# Patient Record
Sex: Female | Born: 1955 | Race: White | Hispanic: No | Marital: Married | Smoking: Never smoker
Health system: Southern US, Community
[De-identification: ages and names within clinical notes are randomized; demographics above are authoritative.]

## PROBLEM LIST (undated history)

## (undated) DIAGNOSIS — Z8619 Personal history of other infectious and parasitic diseases: Secondary | ICD-10-CM

## (undated) DIAGNOSIS — N809 Endometriosis, unspecified: Secondary | ICD-10-CM

## (undated) DIAGNOSIS — Z9889 Other specified postprocedural states: Secondary | ICD-10-CM

## (undated) DIAGNOSIS — I499 Cardiac arrhythmia, unspecified: Secondary | ICD-10-CM

## (undated) DIAGNOSIS — K219 Gastro-esophageal reflux disease without esophagitis: Secondary | ICD-10-CM

## (undated) DIAGNOSIS — H6121 Impacted cerumen, right ear: Secondary | ICD-10-CM

## (undated) DIAGNOSIS — R112 Nausea with vomiting, unspecified: Secondary | ICD-10-CM

## (undated) HISTORY — DX: Endometriosis, unspecified: N80.9

## (undated) HISTORY — DX: Gastro-esophageal reflux disease without esophagitis: K21.9

## (undated) HISTORY — DX: Impacted cerumen, right ear: H61.21

## (undated) HISTORY — DX: Personal history of other infectious and parasitic diseases: Z86.19

## (undated) HISTORY — DX: Cardiac arrhythmia, unspecified: I49.9

---
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
## 1990-08-15 DIAGNOSIS — N809 Endometriosis, unspecified: Secondary | ICD-10-CM

## 1990-08-15 HISTORY — PX: LAPAROSCOPIC ENDOMETRIOSIS FULGURATION: SUR769

## 1990-08-15 HISTORY — DX: Endometriosis, unspecified: N80.9

## 1998-05-27 ENCOUNTER — Other Ambulatory Visit: Admission: RE | Admit: 1998-05-27 | Discharge: 1998-05-27 | Payer: Self-pay | Admitting: Gynecology

## 1999-06-28 ENCOUNTER — Other Ambulatory Visit: Admission: RE | Admit: 1999-06-28 | Discharge: 1999-06-28 | Payer: Self-pay | Admitting: Obstetrics and Gynecology

## 2000-07-11 ENCOUNTER — Other Ambulatory Visit: Admission: RE | Admit: 2000-07-11 | Discharge: 2000-07-11 | Payer: Self-pay | Admitting: Obstetrics and Gynecology

## 2001-07-27 ENCOUNTER — Other Ambulatory Visit: Admission: RE | Admit: 2001-07-27 | Discharge: 2001-07-27 | Payer: Self-pay | Admitting: Obstetrics and Gynecology

## 2002-10-23 ENCOUNTER — Other Ambulatory Visit: Admission: RE | Admit: 2002-10-23 | Discharge: 2002-10-23 | Payer: Self-pay | Admitting: Obstetrics and Gynecology

## 2003-12-10 ENCOUNTER — Other Ambulatory Visit: Admission: RE | Admit: 2003-12-10 | Discharge: 2003-12-10 | Payer: Self-pay | Admitting: Obstetrics and Gynecology

## 2004-12-15 ENCOUNTER — Other Ambulatory Visit: Admission: RE | Admit: 2004-12-15 | Discharge: 2004-12-15 | Payer: Self-pay | Admitting: Obstetrics and Gynecology

## 2006-07-19 ENCOUNTER — Other Ambulatory Visit: Admission: RE | Admit: 2006-07-19 | Discharge: 2006-07-19 | Payer: Self-pay | Admitting: Obstetrics and Gynecology

## 2008-04-03 ENCOUNTER — Encounter: Admission: RE | Admit: 2008-04-03 | Discharge: 2008-04-03 | Payer: Self-pay | Admitting: Family Medicine

## 2010-02-22 ENCOUNTER — Encounter: Admission: RE | Admit: 2010-02-22 | Discharge: 2010-02-22 | Payer: Self-pay | Admitting: Family Medicine

## 2010-05-19 LAB — LAB REPORT - SCANNED: A1c: 5.8

## 2011-07-11 LAB — HM MAMMOGRAPHY: HM Mammogram: NORMAL

## 2011-09-10 LAB — HM PAP SMEAR: HM Pap smear: NORMAL

## 2012-09-26 LAB — HEPATIC FUNCTION PANEL: ALT: 26 U/L (ref 7–35)

## 2012-09-26 LAB — CBC AND DIFFERENTIAL
Hemoglobin: 13.2 g/dL (ref 12.0–16.0)
Platelets: 219 10*3/uL (ref 150–399)
WBC: 4 10^3/mL

## 2012-09-26 LAB — BASIC METABOLIC PANEL
BUN: 11 mg/dL (ref 4–21)
Glucose: 91 mg/dL

## 2012-10-10 ENCOUNTER — Encounter: Payer: Self-pay | Admitting: Internal Medicine

## 2012-10-10 ENCOUNTER — Ambulatory Visit (INDEPENDENT_AMBULATORY_CARE_PROVIDER_SITE_OTHER): Payer: Managed Care, Other (non HMO) | Admitting: Internal Medicine

## 2012-10-10 VITALS — BP 112/74 | HR 82 | Temp 98.7°F | Resp 12 | Ht 63.75 in | Wt 158.5 lb

## 2012-10-10 DIAGNOSIS — K219 Gastro-esophageal reflux disease without esophagitis: Secondary | ICD-10-CM

## 2012-10-10 DIAGNOSIS — Z1239 Encounter for other screening for malignant neoplasm of breast: Secondary | ICD-10-CM

## 2012-10-10 DIAGNOSIS — M545 Low back pain, unspecified: Secondary | ICD-10-CM

## 2012-10-10 DIAGNOSIS — E663 Overweight: Secondary | ICD-10-CM

## 2012-10-10 DIAGNOSIS — Z6825 Body mass index (BMI) 25.0-25.9, adult: Secondary | ICD-10-CM

## 2012-10-10 NOTE — Progress Notes (Signed)
Patient ID: Melanie Ward, female   DOB: [birth year], 57 y.o.   MRN: [id number]   Patient Active Problem List  Diagnosis  . Lumbago  . Overweight (BMI 25.0-29.9)  . GERD (gastroesophageal reflux disease)    Subjective:  CC:   Chief Complaint  Patient presents with  . Establish Care    HPI:    Melanie Ward is a 57 y.o. female who presents as a new patient to establish primary care with the chief complaint of 1) Occasional episodes of acute back pain and right elbow pain.   She has a history of Chondromalacia patella diagnosed by sports medicine physician Dr. Karel Jarvis at [facility]. She has avoided pharmacotherapy and has started an elimination diet to rule out food intolerances . Thus far she has given up dairy and gluten with no appreciable change in symptoms.   Has had formal serologic testing for allergies but no skin testing .   2) History of GERD .  Aggravated by certain foods. Controlled with when necessary use of over-the-counter medications. No history of chronic cough.    Past Medical History  Diagnosis Date  . History of chicken pox   . GERD (gastroesophageal reflux disease)   . Cardiac arrhythmia   . Endometriosis 1992    Past Surgical History  Procedure Laterality Date  . Tonsillectomy and adenoidectomy  1964  . Laparoscopic endometriosis fulguration  1992  . Appendectomy  1973    Family History  Problem Relation Age of Onset  . Cancer Mother     breast  . Hypertension Mother   . Heart disease Father   . Diabetes Maternal Grandmother     History   Social History  . Marital Status: Married    Spouse Name: N/A    Number of Children: N/A  . Years of Education: N/A   Occupational History  . Not on file.   Social History Main Topics  . Smoking status: Never Smoker   . Smokeless tobacco: Never Used  . Alcohol Use: No  . Drug Use: No  . Sexually Active: Not on file   Other Topics Concern  . Not on file   Social History  Narrative  . No narrative on file   Allergies  Allergen Reactions  . Zithromax (Azithromycin) Rash    Review of Systems:   Patient denies headache, fevers, malaise, unintentional weight loss, skin rash, eye pain, sinus congestion and sinus pain, sore throat, dysphagia,  hemoptysis , cough, dyspnea, wheezing, chest pain, palpitations, orthopnea, edema, abdominal pain, nausea, melena, diarrhea, constipation, flank pain, dysuria, hematuria, urinary  Frequency, nocturia, numbness, tingling, seizures,  Focal weakness, Loss of consciousness,  Tremor, insomnia, depression, anxiety, and suicidal ideation.   Objective:  BP 112/74  Pulse 82  Temp(Src) 98.7 F (37.1 C) (Oral)  Resp 12  Ht 5' 3.75" (1.619 m)  Wt 158 lb 8 oz (71.895 kg)  BMI 27.43 kg/m2  SpO2 97%  General appearance: alert, cooperative and appears stated age Ears: normal TM's and external ear canals both ears Throat: lips, mucosa, and tongue normal; teeth and gums normal Neck: no adenopathy, no carotid bruit, supple, symmetrical, trachea midline and thyroid not enlarged, symmetric, no tenderness/mass/nodules Back: symmetric, no curvature. ROM normal. No CVA tenderness. Lungs: clear to auscultation bilaterally Heart: regular rate and rhythm, S1, S2 normal, no murmur, click, rub or gallop Abdomen: soft, non-tender; bowel sounds normal; no masses,  no organomegaly Pulses: 2+ and symmetric Skin: Skin color, texture, turgor normal. No  rashes or lesions Lymph nodes: Cervical, supraclavicular, and axillary nodes normal. MSK: full ROM all joints,  No synvovitis, ulnar deviation or Heberden's nodes  Assessment and Plan:  Overweight (BMI 25.0-29.9) Recent screening by former PCP Dr. Renae Gloss last week noted normal thyroid function normal lipids and a hemoglobin A1c that puts her at increased risk for diabetes of 5.9%..  I have addressed  BMI and recommended a low glycemic index diet utilizing smaller more frequent meals to increase  metabolism.  I have also recommended that patient start exercising with a goal of 30 minutes of aerobic exercise a minimum of 5 days per week.    GERD (gastroesophageal reflux disease) Symptoms do not occur daily and are controlled with when necessary use of H2 blockers and Mylanta.  Lumbago Episodic, with recent testing by former PCP for autoimmune disease due to concurrent elbow pain. Her ANA was positive but her sedimentation rate was 2. Lyme antibodies were also checked and were negative. She has no signs of cellulitis on today's exam. We'll discuss referral to rheumatology with patient at next visit.   Updated Medication List Outpatient Encounter Prescriptions as of 10/10/2012  Medication Sig Dispense Refill  . Cholecalciferol (VITAMIN D) 2000 UNITS CAPS Take by mouth.      . fish oil-omega-3 fatty acids 1000 MG capsule Take 2 g by mouth daily.      . Multiple Vitamin (MULTIVITAMIN) tablet Take 1 tablet by mouth daily.       No facility-administered encounter medications on file as of 10/10/2012.     Orders Placed This Encounter  Procedures  . HM MAMMOGRAPHY  . MM Digital Screening  . HM PAP SMEAR  . HM COLONOSCOPY    No Follow-up on file.

## 2012-10-10 NOTE — Patient Instructions (Addendum)
There is a gluten free potato free vodka called Ciroc   I will review the records from Dr. Renae Gloss and advise you of the results

## 2012-10-11 ENCOUNTER — Encounter: Payer: Self-pay | Admitting: Internal Medicine

## 2012-10-13 ENCOUNTER — Telehealth: Payer: Self-pay | Admitting: Internal Medicine

## 2012-10-13 ENCOUNTER — Encounter: Payer: Self-pay | Admitting: Internal Medicine

## 2012-10-13 DIAGNOSIS — K219 Gastro-esophageal reflux disease without esophagitis: Secondary | ICD-10-CM | POA: Insufficient documentation

## 2012-10-13 NOTE — Assessment & Plan Note (Signed)
Symptoms do not occur daily and are controlled with when necessary use of H2 blockers and Mylanta.

## 2012-10-13 NOTE — Assessment & Plan Note (Signed)
Episodic, with recent testing by former PCP for autoimmune disease due to concurrent elbow pain. Her ANA was positive but her sedimentation rate was 2. Lyme antibodies were also checked and were negative. She has no signs of cellulitis on today's exam. We'll discuss referral to rheumatology with patient at next visit.

## 2012-10-13 NOTE — Assessment & Plan Note (Signed)
Recent screening by former PCP Dr. Renae Gloss last week noted normal thyroid function normal lipids and a hemoglobin A1c that puts her at increased risk for diabetes of 5.9%..  I have addressed  BMI and recommended a low glycemic index diet utilizing smaller more frequent meals to increase metabolism.  I have also recommended that patient start exercising with a goal of 30 minutes of aerobic exercise a minimum of 5 days per week.

## 2012-10-15 ENCOUNTER — Telehealth: Payer: Self-pay | Admitting: Internal Medicine

## 2012-10-15 ENCOUNTER — Encounter: Payer: Self-pay | Admitting: Internal Medicine

## 2012-10-15 DIAGNOSIS — M25531 Pain in right wrist: Secondary | ICD-10-CM

## 2012-11-09 ENCOUNTER — Encounter: Payer: Self-pay | Admitting: Internal Medicine

## 2013-02-27 ENCOUNTER — Encounter: Payer: Self-pay | Admitting: Internal Medicine

## 2013-02-27 ENCOUNTER — Ambulatory Visit (INDEPENDENT_AMBULATORY_CARE_PROVIDER_SITE_OTHER): Payer: Managed Care, Other (non HMO) | Admitting: Internal Medicine

## 2013-02-27 VITALS — BP 108/64 | HR 78 | Temp 98.1°F | Resp 14 | Wt 150.5 lb

## 2013-02-27 DIAGNOSIS — R197 Diarrhea, unspecified: Secondary | ICD-10-CM

## 2013-02-27 DIAGNOSIS — A0472 Enterocolitis due to Clostridium difficile, not specified as recurrent: Secondary | ICD-10-CM | POA: Insufficient documentation

## 2013-02-27 DIAGNOSIS — R21 Rash and other nonspecific skin eruption: Secondary | ICD-10-CM

## 2013-02-27 DIAGNOSIS — K521 Toxic gastroenteritis and colitis: Secondary | ICD-10-CM

## 2013-02-27 DIAGNOSIS — T3695XA Adverse effect of unspecified systemic antibiotic, initial encounter: Secondary | ICD-10-CM

## 2013-02-27 MED ORDER — CIPROFLOXACIN HCL 500 MG PO TABS: 500.0000 mg | ORAL_TABLET | Freq: Two times a day (BID) | ORAL | Status: DC

## 2013-02-27 MED ORDER — METRONIDAZOLE 500 MG PO TABS: 500.0000 mg | ORAL_TABLET | Freq: Four times a day (QID) | ORAL | Status: DC

## 2013-02-27 NOTE — Assessment & Plan Note (Addendum)
Likely viral given rapid imporvement but will test for c dif given recent abx use.,   Stool tested positive for C dificile and was resulted on Sunday.  Rx for metronidazole was given to patient at her visit along with cipro and he was inxtructed at visit to start both if diarrea did not resolve by Friday.  She was notified of the positive results on sunday and advised to start the metronidazole

## 2013-02-27 NOTE — Assessment & Plan Note (Signed)
Given her positive ANA will refer to dermatology for evaluation.  Trial of tar shampoo.

## 2013-02-27 NOTE — Patient Instructions (Addendum)
Your diarrhea is likely a slef limiting viral gastroenteritis and should  resolve on its own with a  Simplified diet (see attached)  However, given your recent antibiotic use, we will rule out C dificile with stool tests. This would be treated with metronidazole  If the diarrhea is NOT  c dificile and does not resolve by Friday,  You can start the cipro/metronidazole

## 2013-02-27 NOTE — Progress Notes (Signed)
Patient ID: Melanie Ward, female   DOB: [birth year], 57 y.o.   MRN: [id number]  Patient Active Problem List   Diagnosis Date Noted  . Antibiotic-associated diarrhea 02/27/2013  . Rash and nonspecific skin eruption 02/27/2013  . Lumbago 10/13/2012  . Overweight (BMI 25.0-29.9) 10/13/2012  . GERD (gastroesophageal reflux disease) 10/13/2012    Subjective:  CC:   Chief Complaint  Patient presents with  . Acute Visit    stomach issue's  , loose stool all day saturday and still not normal.    HPI:   Melanie G Johnsonis a 57 y.o. female who presents for evaluation of 4 day history of diarrhea which is improving.  Patient woke up at 4 am on Saturday with abdominal distension and developed loose stools 10 to 15 throught the next 24 hours.  Sunday felt a little better and stools were less frequent .  No fevers, blood in stools or severe cramping ,  No nausea.  Currently stools are still unformed but no tenesmus and has gone back to work.  recent ingestion of hemp/chocolate bar  For 2 days prior to onset,  And Took oral  clindamycin for 10 days for an oral procedure,  Ending around the fourth of July ,   2) pruritic rash on scalp in occipital area for the last month.  Husband noted a blistering appearance to rash a week or so ago but rash is not painful, just itchy,  No exposure to children or fleas.  No recent travel.  Had a rheumatologic workup earlier in the year for diffuse arthritis symptoms accompanied by a  positive ANA (titers available in chart) but nothing was diagnosed.     Past Medical History  Diagnosis Date  . History of chicken pox   . GERD (gastroesophageal reflux disease)   . Cardiac arrhythmia   . Endometriosis 1992    Past Surgical History  Procedure Laterality Date  . Tonsillectomy and adenoidectomy  1964  . Laparoscopic endometriosis fulguration  1992  . Appendectomy  1973       The following portions of the patient's history were reviewed and updated as  appropriate: Allergies, current medications, and problem list.    Review of Systems:   12 Pt  review of systems was negative except those addressed in the HPI,     History   Social History  . Marital Status: Married    Spouse Name: N/A    Number of Children: N/A  . Years of Education: N/A   Occupational History  . Not on file.   Social History Main Topics  . Smoking status: Never Smoker   . Smokeless tobacco: Never Used  . Alcohol Use: No  . Drug Use: No  . Sexually Active: Not on file   Other Topics Concern  . Not on file   Social History Narrative  . No narrative on file    Objective:  BP 108/64  Pulse 78  Temp(Src) 98.1 F (36.7 C) (Oral)  Resp 14  Wt 150 lb 8 oz (68.266 kg)  BMI 26.04 kg/m2  SpO2 98%  General appearance: alert, cooperative and appears stated age Ears: normal TM's and external ear canals both ears Throat: lips, mucosa, and tongue normal; teeth and gums normal Neck: no adenopathy, no carotid bruit, supple, symmetrical, trachea midline and thyroid not enlarged, symmetric, no tenderness/mass/nodules Back: symmetric, no curvature. ROM normal. No CVA tenderness. Lungs: clear to auscultation bilaterally Heart: regular rate and rhythm, S1, S2 normal, no murmur, click,  rub or gallop Abdomen: soft, non-tender; bowel sounds normal; no masses,  no organomegaly Pulses: 2+ and symmetric Skin: Skin color, texture, turgor normal. No rashes or lesions Lymph nodes: Cervical, supraclavicular, and axillary nodes normal.  Assessment and Plan:  Rash and nonspecific skin eruption Given her positive ANA will refer to dermatology for evaluation.  Trial of tar shampoo.   Antibiotic-associated diarrhea Likely viral given rapid imporvement but will test for c dif given recent abx use.,     Updated Medication List Outpatient Encounter Prescriptions as of 02/27/2013  Medication Sig Dispense Refill  . Cholecalciferol (VITAMIN D) 2000 UNITS CAPS Take by  mouth.      . fish oil-omega-3 fatty acids 1000 MG capsule Take 2 g by mouth daily.      . Multiple Vitamin (MULTIVITAMIN) tablet Take 1 tablet by mouth daily.      . ciprofloxacin (CIPRO) 500 MG tablet Take 1 tablet (500 mg total) by mouth 2 (two) times daily.  14 tablet  0  . metroNIDAZOLE (FLAGYL) 500 MG tablet Take 1 tablet (500 mg total) by mouth 4 (four) times daily.  28 tablet  0  . [DISCONTINUED] ciprofloxacin (CIPRO) 500 MG tablet Take 1 tablet (500 mg total) by mouth 2 (two) times daily.  14 tablet  0   No facility-administered encounter medications on file as of 02/27/2013.     Orders Placed This Encounter  Procedures  . C difficile Toxins A+B W/Rflx  . Ambulatory referral to Dermatology    No Follow-up on file.

## 2013-02-28 NOTE — Addendum Note (Signed)
Addended by: Montine Circle D on: 02/28/2013 08:59 AM   Modules accepted: Orders

## 2013-03-03 ENCOUNTER — Encounter: Payer: Self-pay | Admitting: Internal Medicine

## 2013-03-04 ENCOUNTER — Encounter: Payer: Self-pay | Admitting: Emergency Medicine

## 2013-03-04 ENCOUNTER — Telehealth: Payer: Self-pay | Admitting: Internal Medicine

## 2013-03-04 NOTE — Telephone Encounter (Signed)
Patient Information:  Caller Name: Yesika  Phone: [phone]  Patient: Melanie Ward, Melanie Ward  Gender: Female  DOB: [birth year]  Age: 57 Years  PCP: Duncan Dull (Adults only)  Office Follow Up:  Does the office need to follow up with this patient?: No  Instructions For The Office: N/A  RN Note:  MD message regarding + stool culture and treatment plan read to patient. Agreed to take Metronidazole as directed.    Symptoms  Reason For Call & Symptoms: Called for message after missed call on cell phone.  [place].  Per Epic,  reviewed my chart message from Dr Darrick Huntsman 03/03/13 regarding + stool culture for C-Diff.  MD gave Rx for Metronidzaole at time of office visit. Continues to have more frequent stools than normal with abnormal smell and appearance.  Verbalized understanding of instructions including need completed Rx as directed (QID X 7 days) and to avoid all alcohol while using Metronidiazole.  Reviewed Health History In EMR: N/A  Reviewed Medications In EMR: N/A  Reviewed Allergies In EMR: N/A  Reviewed Surgeries / Procedures: N/A  Date of Onset of Symptoms: Unknown  Guideline(s) Used:  No Protocol Available - Information Only  Disposition Per Guideline:   Home Care  Reason For Disposition Reached:   Information only question and nurse able to answer  Advice Given:  Call Back If:  New symptoms develop  You become worse.  Patient Will Follow Care Advice:  YES

## 2013-05-27 ENCOUNTER — Encounter: Payer: Self-pay | Admitting: Internal Medicine

## 2013-06-19 ENCOUNTER — Ambulatory Visit (INDEPENDENT_AMBULATORY_CARE_PROVIDER_SITE_OTHER): Payer: Managed Care, Other (non HMO) | Admitting: Internal Medicine

## 2013-06-19 ENCOUNTER — Encounter: Payer: Self-pay | Admitting: Internal Medicine

## 2013-06-19 VITALS — BP 130/70 | HR 81 | Temp 98.1°F | Ht 63.75 in | Wt 155.5 lb

## 2013-06-19 DIAGNOSIS — E785 Hyperlipidemia, unspecified: Secondary | ICD-10-CM

## 2013-06-19 DIAGNOSIS — R21 Rash and other nonspecific skin eruption: Secondary | ICD-10-CM

## 2013-06-19 DIAGNOSIS — A0472 Enterocolitis due to Clostridium difficile, not specified as recurrent: Secondary | ICD-10-CM

## 2013-06-19 DIAGNOSIS — E663 Overweight: Secondary | ICD-10-CM

## 2013-06-19 DIAGNOSIS — R5381 Other malaise: Secondary | ICD-10-CM

## 2013-06-19 DIAGNOSIS — Z6825 Body mass index (BMI) 25.0-25.9, adult: Secondary | ICD-10-CM

## 2013-06-19 DIAGNOSIS — M25579 Pain in unspecified ankle and joints of unspecified foot: Secondary | ICD-10-CM

## 2013-06-19 DIAGNOSIS — R635 Abnormal weight gain: Secondary | ICD-10-CM

## 2013-06-19 NOTE — Progress Notes (Signed)
Pre-visit discussion using our clinic review tool. No additional management support is needed unless otherwise documented below in the visit note.  

## 2013-06-19 NOTE — Progress Notes (Signed)
Patient ID: Geannie Risen, female   DOB: 11/16/1955, 57 y.o.   MRN: [id number]  Patient Active Problem List   Diagnosis Date Noted  . Other malaise and fatigue 06/21/2013  . Enteritis due to Clostridium difficile 02/27/2013  . Rash and nonspecific skin eruption 02/27/2013  . Lumbago 10/13/2012  . Overweight (BMI 25.0-29.9) 10/13/2012  . GERD (gastroesophageal reflux disease) 10/13/2012    Subjective:  CC:   Chief Complaint  Patient presents with  . Follow-up    not feeling well, craving sweets (pre diabetic), & fatigue x 2 months     HPI:   Calina G Johnsonis a 57 y.o. female who presents with fatigue and malaise for the past 2 months .  Was feeling great after starting an exercise progream and changing diet to gluten free.  The in August developed a diffuse herpetiform /papular rash on hips,  Elbows  Back of knees and hands. Lasted for a month.  Saw dermatologists ( Dr Dorita Sciara)  [place] in [place] Who biopsied one of the lesions.  Path was reportedly negative for celiac disease ,  Looked like drug induced , but she was not taking any medication except the same MVI she had been taking for over a year.  Rash resolved spontaneously but she continues to have several papules on both flanks.  No fevers,  URi symptoms, headaches,   Since then has been unable to exercise and maintain a sensible diet.  Feels aching pain diffusely. Some joints ache each morning but improve with activity     Past Medical History  Diagnosis Date  . History of chicken pox   . GERD (gastroesophageal reflux disease)   . Cardiac arrhythmia   . Endometriosis 1992    Past Surgical History  Procedure Laterality Date  . Tonsillectomy and adenoidectomy  1964  . Laparoscopic endometriosis fulguration  1992  . Appendectomy  1973       The following portions of the patient's history were reviewed and updated as appropriate: Allergies, current medications, and problem list.    Review of Systems:   12 Pt  review of  systems was negative except those addressed in the HPI,     History   Social History  . Marital Status: Married    Spouse Name: N/A    Number of Children: N/A  . Years of Education: N/A   Occupational History  . Not on file.   Social History Main Topics  . Smoking status: Never Smoker   . Smokeless tobacco: Never Used  . Alcohol Use: No  . Drug Use: No  . Sexual Activity: Yes    Birth Control/ Protection: Post-menopausal   Other Topics Concern  . Not on file   Social History Narrative  . No narrative on file    Objective:  Filed Vitals:   06/19/13 1408  BP: 130/70  Pulse: 81  Temp: 98.1 F (36.7 C)     General appearance: alert, cooperative and appears stated age Ears: normal TM's and external ear canals both ears Throat: lips, mucosa, and tongue normal; teeth and gums normal Neck: no adenopathy, no carotid bruit, supple, symmetrical, trachea midline and thyroid not enlarged, symmetric, no tenderness/mass/nodules Back: symmetric, no curvature. ROM normal. No CVA tenderness. Lungs: clear to auscultation bilaterally Heart: regular rate and rhythm, S1, S2 normal, no murmur, click, rub or gallop Abdomen: soft, non-tender; bowel sounds normal; no masses,  no organomegaly Pulses: 2+ and symmetric Skin: Skin color, texture, turgor normal. No rashes or lesions  Lymph nodes: Cervical, supraclavicular, and axillary nodes normal.  Assessment and Plan:  Rash and nonspecific skin eruption With subsequent spread to torso and arms resulting in dermatology evaluation with biopsy reportedly showing drug effect.  May have been due to ciprofloxacin /metronidazole given in late July for C dificile enteritis.  Will rule out tick borne illness exposure.  Records requested  Overweight (BMI 25.0-29.9) I have  recommended low gluten/low glycemic index diet and regular exercise a minimum of 5 days per week. To help her achieve her goal     Other malaise and fatigue Exam is  normal. She is not anemic, no signs of inflammation by current labs.  She will return for fasting labs at her leisure.   A total of 40 minutes was spent with patient more than half of which was spent in counseling, reviewing records from other providers and coordination of care.  Updated Medication List Outpatient Encounter Prescriptions as of 06/19/2013  Medication Sig  . Cholecalciferol (VITAMIN D) 2000 UNITS CAPS Take by mouth.  . fish oil-omega-3 fatty acids 1000 MG capsule Take 2 g by mouth daily.  . Multiple Vitamin (MULTIVITAMIN) tablet Take 1 tablet by mouth daily.  . [DISCONTINUED] ciprofloxacin (CIPRO) 500 MG tablet Take 1 tablet (500 mg total) by mouth 2 (two) times daily.  . [DISCONTINUED] metroNIDAZOLE (FLAGYL) 500 MG tablet Take 1 tablet (500 mg total) by mouth 4 (four) times daily.     Orders Placed This Encounter  Procedures  . Lipid panel  . Hemoglobin A1c  . CBC with Differential  . TSH  . C-reactive protein  . Lyme Mauricia Area. Blt. IgG & IgM w/bands  . Rocky mtn spotted fvr abs pnl(IgG+IgM)  . Ehrlichia antibody panel  . CBC and differential  . Basic metabolic panel  . Hepatic function panel    No Follow-up on file.

## 2013-06-20 ENCOUNTER — Other Ambulatory Visit (INDEPENDENT_AMBULATORY_CARE_PROVIDER_SITE_OTHER): Payer: Managed Care, Other (non HMO)

## 2013-06-20 DIAGNOSIS — E785 Hyperlipidemia, unspecified: Secondary | ICD-10-CM

## 2013-06-20 DIAGNOSIS — R21 Rash and other nonspecific skin eruption: Secondary | ICD-10-CM

## 2013-06-20 DIAGNOSIS — R635 Abnormal weight gain: Secondary | ICD-10-CM

## 2013-06-20 DIAGNOSIS — M25579 Pain in unspecified ankle and joints of unspecified foot: Secondary | ICD-10-CM

## 2013-06-20 LAB — CBC WITH DIFFERENTIAL/PLATELET
Basophils Relative: 0.7 % (ref 0.0–3.0)
Eosinophils Absolute: 0.1 10*3/uL (ref 0.0–0.7)
HCT: 38.8 % (ref 36.0–46.0)
MCHC: 34.1 g/dL (ref 30.0–36.0)
MCV: 92.8 fl (ref 78.0–100.0)
Monocytes Relative: 7.8 % (ref 3.0–12.0)
Neutrophils Relative %: 44.3 % (ref 43.0–77.0)
RBC: 4.18 Mil/uL (ref 3.87–5.11)
RDW: 13.1 % (ref 11.5–14.6)
WBC: 4.4 10*3/uL — ABNORMAL LOW (ref 4.5–10.5)

## 2013-06-20 LAB — TSH: TSH: 1.01 u[IU]/mL (ref 0.35–5.50)

## 2013-06-20 LAB — LIPID PANEL
HDL: 61.3 mg/dL (ref >39.00)
Triglycerides: 91 mg/dL (ref 0.0–149.0)
VLDL: 18.2 mg/dL (ref 0.0–40.0)

## 2013-06-20 LAB — C-REACTIVE PROTEIN: CRP: <0.5 mg/dL (ref 0.5–20.0)

## 2013-06-20 LAB — HEMOGLOBIN A1C: Hgb A1c MFr Bld: 5.9 % (ref 4.6–6.5)

## 2013-06-21 ENCOUNTER — Encounter: Payer: Self-pay | Admitting: Internal Medicine

## 2013-06-21 DIAGNOSIS — R5381 Other malaise: Secondary | ICD-10-CM | POA: Insufficient documentation

## 2013-06-21 DIAGNOSIS — R5383 Other fatigue: Secondary | ICD-10-CM | POA: Insufficient documentation

## 2013-06-21 LAB — LYME ABY, WSTRN BLT IGG & IGM W/BANDS
Lyme Disease 18 kD IgG: NONREACTIVE
Lyme Disease 23 kD IgG: NONREACTIVE
Lyme Disease 39 kD IgM: NONREACTIVE
Lyme Disease 45 kD IgG: NONREACTIVE
Lyme Disease 58 kD IgG: NONREACTIVE
Lyme Disease 66 kD IgG: NONREACTIVE
Lyme Disease 93 kD IgG: NONREACTIVE

## 2013-06-21 NOTE — Assessment & Plan Note (Addendum)
With subsequent spread to torso and arms resulting in dermatology evaluation with biopsy reportedly showing drug effect.  May have been due to ciprofloxacin /metornodizaole given in late July for C dificile enteritis.  Records requested

## 2013-06-21 NOTE — Assessment & Plan Note (Signed)
Exam is normal.

## 2013-06-21 NOTE — Assessment & Plan Note (Signed)
I have  recommended low gluten/low glycemic index diet and regular exercise a minimum of 5 days per week. To help her achieve her goal

## 2013-06-22 LAB — EHRLICHIA ANTIBODY PANEL
E chaffeensis (HGE) Ab, IgG: <1:64 {titer}
E chaffeensis (HGE) Ab, IgM: <1:20 {titer}

## 2013-06-24 LAB — ROCKY MTN SPOTTED FVR ABS PNL(IGG+IGM): RMSF IgM: 0.47 IV

## 2013-06-25 ENCOUNTER — Encounter: Payer: Self-pay | Admitting: Internal Medicine

## 2013-07-15 ENCOUNTER — Encounter: Payer: Self-pay | Admitting: Internal Medicine

## 2013-07-16 ENCOUNTER — Ambulatory Visit (INDEPENDENT_AMBULATORY_CARE_PROVIDER_SITE_OTHER): Payer: Managed Care, Other (non HMO) | Admitting: Internal Medicine

## 2013-07-16 ENCOUNTER — Encounter: Payer: Self-pay | Admitting: Internal Medicine

## 2013-07-16 VITALS — BP 108/60 | HR 99 | Temp 98.3°F | Resp 12 | Ht 64.0 in | Wt 158.8 lb

## 2013-07-16 DIAGNOSIS — N760 Acute vaginitis: Secondary | ICD-10-CM

## 2013-07-16 DIAGNOSIS — B9689 Other specified bacterial agents as the cause of diseases classified elsewhere: Secondary | ICD-10-CM

## 2013-07-16 DIAGNOSIS — A499 Bacterial infection, unspecified: Secondary | ICD-10-CM

## 2013-07-16 DIAGNOSIS — N898 Other specified noninflammatory disorders of vagina: Secondary | ICD-10-CM

## 2013-07-16 DIAGNOSIS — N390 Urinary tract infection, site not specified: Secondary | ICD-10-CM

## 2013-07-16 DIAGNOSIS — R300 Dysuria: Secondary | ICD-10-CM

## 2013-07-16 LAB — POCT URINALYSIS DIPSTICK
Bilirubin, UA: NEGATIVE
Nitrite, UA: NEGATIVE
Spec Grav, UA: <=1.005
pH, UA: 6

## 2013-07-16 MED ORDER — SULFAMETHOXAZOLE-TRIMETHOPRIM 800-160 MG PO TABS: 1.0000 | ORAL_TABLET | Freq: Two times a day (BID) | ORAL | Status: DC

## 2013-07-16 NOTE — Progress Notes (Signed)
Pre-visit discussion using our clinic review tool. No additional management support is needed unless otherwise documented below in the visit note.  

## 2013-07-16 NOTE — Telephone Encounter (Signed)
Spoke with pt, complaining of dysuria and urgency, urinary odor. Appt scheduled for today with Dr . Darrick Huntsman to evaluate

## 2013-07-16 NOTE — Patient Instructions (Addendum)
I am treating you with Septra DS for one week to manage the Staph infection that is causing your perineal furuncle (boil)  Please take a probiotic ( Align, Floraque or Culturelle) while you are on the antibiotic to prevent a serious antibiotic associated diarrhea  Called clostirudium dificile colitis   I have cultured your urine and vaginal discharge and will let you know if either show infection

## 2013-07-17 ENCOUNTER — Telehealth: Payer: Self-pay | Admitting: Internal Medicine

## 2013-07-17 ENCOUNTER — Encounter: Payer: Self-pay | Admitting: Internal Medicine

## 2013-07-17 DIAGNOSIS — R300 Dysuria: Secondary | ICD-10-CM

## 2013-07-17 DIAGNOSIS — B9689 Other specified bacterial agents as the cause of diseases classified elsewhere: Secondary | ICD-10-CM | POA: Insufficient documentation

## 2013-07-17 LAB — URINE CULTURE
Colony Count: NO GROWTH
Organism ID, Bacteria: NO GROWTH

## 2013-07-17 MED ORDER — METRONIDAZOLE 500 MG PO TABS: 500.0000 mg | ORAL_TABLET | Freq: Two times a day (BID) | ORAL | Status: DC

## 2013-07-17 NOTE — Progress Notes (Signed)
Patient ID: Melanie Ward, female   DOB: 1956/08/13, 57 y.o.   MRN: [id number]  Patient Active Problem List   Diagnosis Date Noted  . Bacterial vaginosis 07/17/2013  . Dysuria 07/17/2013  . Other malaise and fatigue 06/21/2013  . Enteritis due to Clostridium difficile 02/27/2013  . Rash and nonspecific skin eruption 02/27/2013  . Lumbago 10/13/2012  . Overweight (BMI 25.0-29.9) 10/13/2012  . GERD (gastroesophageal reflux disease) 10/13/2012    Subjective:  CC:   Chief Complaint  Patient presents with  . Acute Visit  . Urinary Tract Infection    HPI:   Melanie G Johnsonis a 57 y.o. female who presents Suprapubic discomfort and urinary frequency for the last 4 days.  No recent use of antibiotics, travel, sexual encounters or swimming.  Denies back pain, fevers, and nausea,  But has been having some vaginal discharge.    Past Medical History  Diagnosis Date  . History of chicken pox   . GERD (gastroesophageal reflux disease)   . Cardiac arrhythmia   . Endometriosis 1992    Past Surgical History  Procedure Laterality Date  . Tonsillectomy and adenoidectomy  1964  . Laparoscopic endometriosis fulguration  1992  . Appendectomy  1973       The following portions of the patient's history were reviewed and updated as appropriate: Allergies, current medications, and problem list.    Review of Systems:   12 Pt  review of systems was negative except those addressed in the HPI,     History   Social History  . Marital Status: Married    Spouse Name: N/A    Number of Children: N/A  . Years of Education: N/A   Occupational History  . Not on file.   Social History Main Topics  . Smoking status: Never Smoker   . Smokeless tobacco: Never Used  . Alcohol Use: No  . Drug Use: No  . Sexual Activity: Yes    Birth Control/ Protection: Post-menopausal   Other Topics Concern  . Not on file   Social History Narrative  . No narrative on file    Objective:  Filed  Vitals:   07/16/13 1105  BP: 108/60  Pulse: 99  Temp: 98.3 F (36.8 C)  Resp: 12     General appearance: alert, cooperative and appears stated age Back: symmetric, no curvature. ROM normal. No CVA tenderness. Lungs: clear to auscultation bilaterally Heart: regular rate and rhythm, S1, S2 normal, no murmur, click, rub or gallop Abdomen: soft, non-tender; bowel sounds normal; no masses,  no organomegaly Pulses: 2+ and symmetric Skin: Skin color, texture, turgor normal. No rashes or lesions GYN: vaginal discharge noted, thin yellow.  No CMT .   Lymph nodes: Cervical, supraclavicular, and axillary nodes normal.  Assessment and Plan:  Bacterial vaginosis By wet prep done today   Treating with flagyl 500 mg bid x 7 days    Dysuria UA is abnormal,  Empiric septra started  A total of 30 minutes was spent with patient more than half of which was spent in counseling, reviewing records from other prviders and coordination of care.   Updated Medication List Outpatient Encounter Prescriptions as of 07/16/2013  Medication Sig  . Cholecalciferol (VITAMIN D) 2000 UNITS CAPS Take by mouth.  . fish oil-omega-3 fatty acids 1000 MG capsule Take 2 g by mouth daily.  . Multiple Vitamin (MULTIVITAMIN) tablet Take 1 tablet by mouth daily.  . metroNIDAZOLE (FLAGYL) 500 MG tablet Take 1 tablet (500 mg total)  by mouth 2 (two) times daily.  Marland Kitchen sulfamethoxazole-trimethoprim (SEPTRA DS) 800-160 MG per tablet Take 1 tablet by mouth 2 (two) times daily.     Orders Placed This Encounter  Procedures  . Urine Culture  . Micro Review  . WET PREP BY MOLECULAR PROBE  . POCT Urinalysis Dipstick    No Follow-up on file.

## 2013-07-17 NOTE — Assessment & Plan Note (Signed)
By wet prep done today   Treating with flagyl 500 mg bid x 7 days

## 2013-07-17 NOTE — Assessment & Plan Note (Signed)
Empiric Septra was started but culture was normal, so Septra was stopped.

## 2013-07-17 NOTE — Assessment & Plan Note (Signed)
UA is abnormal,  Empiric septra started

## 2013-07-29 ENCOUNTER — Encounter: Payer: Self-pay | Admitting: Internal Medicine

## 2013-08-19 ENCOUNTER — Encounter: Payer: Self-pay | Admitting: Adult Health

## 2013-08-19 ENCOUNTER — Telehealth: Payer: Self-pay | Admitting: Internal Medicine

## 2013-08-19 ENCOUNTER — Ambulatory Visit (INDEPENDENT_AMBULATORY_CARE_PROVIDER_SITE_OTHER): Payer: BC Managed Care – PPO | Admitting: Adult Health

## 2013-08-19 VITALS — BP 106/60 | HR 84 | Temp 98.4°F | Resp 12 | Wt 159.0 lb

## 2013-08-19 DIAGNOSIS — R079 Chest pain, unspecified: Secondary | ICD-10-CM

## 2013-08-19 DIAGNOSIS — R1013 Epigastric pain: Secondary | ICD-10-CM | POA: Insufficient documentation

## 2013-08-19 MED ORDER — OMEPRAZOLE 20 MG PO CPDR: 20.0000 mg | DELAYED_RELEASE_CAPSULE | Freq: Every day | ORAL | Status: DC

## 2013-08-19 MED ORDER — RANITIDINE HCL 150 MG PO TABS: 150.0000 mg | ORAL_TABLET | Freq: Two times a day (BID) | ORAL | Status: DC

## 2013-08-19 NOTE — Assessment & Plan Note (Signed)
Suspect GERD. EKG without any acute findings. Start prilosec 20 mg daily. Zantac 150 mg bid as needed. Send for abdominal ultrasound. Pt with report of pain radiating to her back.

## 2013-08-19 NOTE — Progress Notes (Signed)
   Subjective:    Patient ID: Melanie Ward, female    DOB: Feb 11, 1956, 58 y.o.   MRN: [id number]  HPI  Patient is a pleasant 58 year old female who presents to clinic with epigastric discomfort. She reports the discomfort feeling like a burning sensation. She was also having a sensation of tightness in her chest. Reports eliminating all gluten from her diet; however, just prior to the holidays she reports "falling off the wagon". Symptoms began shortly after dietary indiscretions. She reports symptoms beginning on or around December 14. Denies fever or chills. Reports normal appetite. Also reports pain radiating to her back. Worse after meals.   Current Outpatient Prescriptions on File Prior to Visit  Medication Sig Dispense Refill  . Cholecalciferol (VITAMIN D) 2000 UNITS CAPS Take by mouth.      . fish oil-omega-3 fatty acids 1000 MG capsule Take 2 g by mouth daily.      . Multiple Vitamin (MULTIVITAMIN) tablet Take 1 tablet by mouth daily.       No current facility-administered medications on file prior to visit.     Review of Systems  Constitutional: Positive for fatigue. Negative for fever, chills and appetite change.  Gastrointestinal: Positive for abdominal pain. Negative for nausea, vomiting, diarrhea and constipation.       Gnawing feeling  Genitourinary: Negative for dysuria and difficulty urinating.       Objective:   Physical Exam  Constitutional: She is oriented to person, place, and time. She appears well-developed and well-nourished. No distress.  Cardiovascular: Normal rate, regular rhythm and normal heart sounds.  Exam reveals no gallop and no friction rub.   No murmur heard. Pulmonary/Chest: Effort normal and breath sounds normal. No respiratory distress.  Abdominal: Soft. Bowel sounds are normal. She exhibits no distension and no mass. There is tenderness. There is no rebound and no guarding.  Epigastric discomfort upon palpation  Musculoskeletal: Normal range  of motion.  Neurological: She is alert and oriented to person, place, and time.  Skin: Skin is warm and dry.  Psychiatric: She has a normal mood and affect. Her behavior is normal. Judgment and thought content normal.     BP 106/60  Pulse 84  Temp(Src) 98.4 F (36.9 C) (Oral)  Resp 12  Wt 159 lb (72.122 kg)  SpO2 96%      Assessment & Plan:

## 2013-08-19 NOTE — Telephone Encounter (Signed)
Pt made my chart message see below is it ok to wait till 08/21/13  ointment For: Melanie Ward, Melanie Ward ([id number])   Visit Type: MYCHART OFFICE VISIT [id number])      08/21/2013 4:15 PM 15 mins. Crecencio Mc, MD [facility]       Patient Comments:   Office Visit   chest heaviness, stomach discomfort,

## 2013-08-19 NOTE — Patient Instructions (Signed)
  Start Prilosec 20 mg in the morning. Take this 30 minutes prior to taking any other medication.  You may also take Zantac 150 mg twice a day as needed.  I am sending you for total abdominal ultrasound. Amber will help schedule this with you.  The office will contact you once we obtain the results.

## 2013-08-19 NOTE — Progress Notes (Signed)
Pre visit review using our clinic review tool, if applicable. No additional management support is needed unless otherwise documented below in the visit note. 

## 2013-08-19 NOTE — Telephone Encounter (Signed)
Spoke with pt, coming in today at 3:00 [facility]

## 2013-08-20 ENCOUNTER — Encounter: Payer: Self-pay | Admitting: Adult Health

## 2013-08-20 ENCOUNTER — Ambulatory Visit: Payer: Self-pay | Admitting: Adult Health

## 2013-08-20 LAB — CBC WITH DIFFERENTIAL/PLATELET
BASOS ABS: 0 10*3/uL (ref 0.0–0.1)
Basophils Relative: 0.4 % (ref 0.0–3.0)
EOS PCT: 1.4 % (ref 0.0–5.0)
Eosinophils Absolute: 0.1 10*3/uL (ref 0.0–0.7)
HCT: 37.2 % (ref 36.0–46.0)
HEMOGLOBIN: 12.6 g/dL (ref 12.0–15.0)
LYMPHS ABS: 2.2 10*3/uL (ref 0.7–4.0)
Lymphocytes Relative: 40.9 % (ref 12.0–46.0)
MCHC: 33.9 g/dL (ref 30.0–36.0)
MCV: 93 fl (ref 78.0–100.0)
Monocytes Absolute: 0.4 10*3/uL (ref 0.1–1.0)
Monocytes Relative: 7.8 % (ref 3.0–12.0)
NEUTROS PCT: 49.5 % (ref 43.0–77.0)
Neutro Abs: 2.7 10*3/uL (ref 1.4–7.7)
Platelets: 200 10*3/uL (ref 150.0–400.0)
RBC: 4 Mil/uL (ref 3.87–5.11)
RDW: 13.2 % (ref 11.5–14.6)
WBC: 5.4 10*3/uL (ref 4.5–10.5)

## 2013-08-20 LAB — COMPREHENSIVE METABOLIC PANEL
ALBUMIN: 4.4 g/dL (ref 3.5–5.2)
ALK PHOS: 96 U/L (ref 39–117)
ALT: 24 U/L (ref 0–35)
AST: 28 U/L (ref 0–37)
BUN: 16 mg/dL (ref 6–23)
CALCIUM: 9.4 mg/dL (ref 8.4–10.5)
CHLORIDE: 104 meq/L (ref 96–112)
CO2: 28 mEq/L (ref 19–32)
CREATININE: 0.7 mg/dL (ref 0.4–1.2)
GFR: 86.69 mL/min (ref >60.00)
GLUCOSE: 99 mg/dL (ref 70–99)
Potassium: 4.5 mEq/L (ref 3.5–5.1)
SODIUM: 138 meq/L (ref 135–145)
TOTAL PROTEIN: 6.8 g/dL (ref 6.0–8.3)
Total Bilirubin: 0.4 mg/dL (ref 0.3–1.2)

## 2013-08-21 ENCOUNTER — Ambulatory Visit: Payer: Self-pay | Admitting: Internal Medicine

## 2013-08-26 ENCOUNTER — Encounter: Payer: Self-pay | Admitting: Internal Medicine

## 2013-08-26 ENCOUNTER — Ambulatory Visit: Payer: Self-pay | Admitting: Internal Medicine

## 2013-08-26 DIAGNOSIS — R079 Chest pain, unspecified: Secondary | ICD-10-CM

## 2013-08-26 NOTE — Telephone Encounter (Signed)
I don't have any appts open today, but i would like you to get  a chest x ray sometime today when you can.  It can be done at [facility]  We will send the order from to the Radiology Dept

## 2013-08-27 ENCOUNTER — Telehealth: Payer: Self-pay | Admitting: Internal Medicine

## 2013-09-19 ENCOUNTER — Encounter: Payer: Self-pay | Admitting: Adult Health

## 2013-09-23 ENCOUNTER — Encounter: Payer: Self-pay | Admitting: Internal Medicine

## 2013-12-18 ENCOUNTER — Ambulatory Visit (INDEPENDENT_AMBULATORY_CARE_PROVIDER_SITE_OTHER): Payer: BC Managed Care – PPO | Admitting: Internal Medicine

## 2013-12-18 ENCOUNTER — Encounter: Payer: Self-pay | Admitting: Internal Medicine

## 2013-12-18 VITALS — BP 112/68 | HR 96 | Temp 98.9°F | Resp 14 | Ht 64.25 in | Wt 159.5 lb

## 2013-12-18 DIAGNOSIS — N63 Unspecified lump in unspecified breast: Secondary | ICD-10-CM

## 2013-12-18 DIAGNOSIS — D2361 Other benign neoplasm of skin of right upper limb, including shoulder: Secondary | ICD-10-CM

## 2013-12-18 DIAGNOSIS — D2360 Other benign neoplasm of skin of unspecified upper limb, including shoulder: Secondary | ICD-10-CM

## 2013-12-18 DIAGNOSIS — A0472 Enterocolitis due to Clostridium difficile, not specified as recurrent: Secondary | ICD-10-CM

## 2013-12-18 DIAGNOSIS — N6320 Unspecified lump in the left breast, unspecified quadrant: Secondary | ICD-10-CM

## 2013-12-18 DIAGNOSIS — Z0000 Encounter for general adult medical examination without abnormal findings: Secondary | ICD-10-CM

## 2013-12-18 DIAGNOSIS — Z1239 Encounter for other screening for malignant neoplasm of breast: Secondary | ICD-10-CM

## 2013-12-18 DIAGNOSIS — E663 Overweight: Secondary | ICD-10-CM

## 2013-12-18 DIAGNOSIS — L659 Nonscarring hair loss, unspecified: Secondary | ICD-10-CM

## 2013-12-18 LAB — T4, FREE: FREE T4: 0.73 ng/dL (ref 0.60–1.60)

## 2013-12-18 LAB — TSH: TSH: 0.86 u[IU]/mL (ref 0.35–4.50)

## 2013-12-18 NOTE — Patient Instructions (Signed)
You had your annual  wellness exam today.  We will repeat your PAP smear in 2016  We will schedule your mammogram at  [facility]   We will contact you with the bloodwork results  Referral to Dr Kellie Moor  underway

## 2013-12-18 NOTE — Progress Notes (Signed)
Patient ID: Melanie Ward, female   DOB: 1956-02-20, 58 y.o.   MRN: [id number]  Subjective:     Melanie Ward is a 58 y.o. female and is here for a comprehensive physical exam. The patient reports no problems.  History   Social History  . Marital Status: Married    Spouse Name: N/A    Number of Children: N/A  . Years of Education: N/A   Occupational History  . Not on file.   Social History Main Topics  . Smoking status: Never Smoker   . Smokeless tobacco: Never Used  . Alcohol Use: No  . Drug Use: No  . Sexual Activity: Yes    Birth Control/ Protection: Post-menopausal   Other Topics Concern  . Not on file   Social History Narrative  . No narrative on file   Health Maintenance  Topic Date Due  . Influenza Vaccine  03/15/2014  . Pap Smear  09/09/2014  . Tetanus/tdap  10/14/2014  . Mammogram  10/19/2014  . Colonoscopy  01/13/2017    The following portions of the patient's history were reviewed and updated as appropriate: allergies, current medications, past family history, past medical history, past social history, past surgical history and problem list.  Review of Systems A comprehensive review of systems was negative.   Objective:   BP 112/68  Pulse 96  Temp(Src) 98.9 F (37.2 C) (Oral)  Resp 14  Ht 5' 4.25" (1.632 m)  Wt 159 lb 8 oz (72.349 kg)  BMI 27.16 kg/m2  SpO2 99%  General appearance: alert, cooperative and appears stated age Head: Normocephalic, without obvious abnormality, atraumatic Eyes: conjunctivae/corneas clear. PERRL, EOM's intact. Fundi benign. Ears: normal TM's and external ear canals both ears Nose: Nares normal. Septum midline. Mucosa normal. No drainage or sinus tenderness. Throat: lips, mucosa, and tongue normal; teeth and gums normal Neck: no adenopathy, no carotid bruit, no JVD, supple, symmetrical, trachea midline and thyroid not enlarged, symmetric, no tenderness/mass/nodules Lungs: clear to auscultation bilaterally Breasts:  right breast: normal appearance, no masses or tenderness. Left breast : irregular mass at left nipple  Heart: regular rate and rhythm, S1, S2 normal, no murmur, click, rub or gallop Abdomen: soft, non-tender; bowel sounds normal; no masses,  no organomegaly Extremities: extremities normal, atraumatic, no cyanosis or edema Pulses: 2+ and symmetric Skin: Skin color, texture, turgor normal. No rashes or lesions Neurologic: Alert and oriented X 3, normal strength and tone. Normal symmetric reflexes. Normal coordination and gait.   .    Assessment and Plan:   Dysplastic nevus of right upper extremity Referral to Dr Kellie Moor underway  Overweight (BMI 25.0-29.9) I have addressed  BMI and recommended wt loss of 10% of body weigh over the next 6 months using a low glycemic index diet and regular exercise a minimum of 5 days per week.    Enteritis due to Clostridium difficile No recurrence.   Mass of left breast sending for diagnostic mammogram  Encounter for preventive health examination Annual comprehensive exam was done including breast, excluding pelvic and PAP smear. All screenings have been addressed .    Updated Medication List Outpatient Encounter Prescriptions as of 12/18/2013  Medication Sig  . Cholecalciferol (VITAMIN D) 2000 UNITS CAPS Take by mouth.  . fish oil-omega-3 fatty acids 1000 MG capsule Take 2 g by mouth daily.  . Multiple Vitamin (MULTIVITAMIN) tablet Take 1 tablet by mouth daily.  Marland Kitchen omeprazole (PRILOSEC) 20 MG capsule Take 1 capsule (20 mg total) by mouth daily.  Marland Kitchen  ranitidine (ZANTAC) 150 MG tablet Take 1 tablet (150 mg total) by mouth 2 (two) times daily.

## 2013-12-18 NOTE — Progress Notes (Signed)
Pre-visit discussion using our clinic review tool. No additional management support is needed unless otherwise documented below in the visit note.  

## 2013-12-19 ENCOUNTER — Encounter: Payer: Self-pay | Admitting: *Deleted

## 2013-12-20 DIAGNOSIS — Z0000 Encounter for general adult medical examination without abnormal findings: Secondary | ICD-10-CM | POA: Insufficient documentation

## 2013-12-20 DIAGNOSIS — Z0001 Encounter for general adult medical examination with abnormal findings: Secondary | ICD-10-CM | POA: Insufficient documentation

## 2013-12-20 DIAGNOSIS — N644 Mastodynia: Secondary | ICD-10-CM | POA: Insufficient documentation

## 2013-12-20 DIAGNOSIS — D2361 Other benign neoplasm of skin of right upper limb, including shoulder: Secondary | ICD-10-CM | POA: Insufficient documentation

## 2013-12-20 NOTE — Assessment & Plan Note (Signed)
Annual comprehensive exam was done including breast, excluding pelvic and PAP smear. All screenings have been addressed .  

## 2013-12-20 NOTE — Assessment & Plan Note (Signed)
I have addressed  BMI and recommended wt loss of 10% of body weigh over the next 6 months using a low glycemic index diet and regular exercise a minimum of 5 days per week.   

## 2013-12-20 NOTE — Assessment & Plan Note (Signed)
Referral to Dr Kellie Moor underway

## 2013-12-20 NOTE — Assessment & Plan Note (Signed)
No recurrence. 

## 2013-12-20 NOTE — Assessment & Plan Note (Signed)
sending for diagnostic mammogram

## 2013-12-26 ENCOUNTER — Encounter: Payer: Self-pay | Admitting: Internal Medicine

## 2013-12-26 LAB — HM MAMMOGRAPHY

## 2014-01-15 ENCOUNTER — Encounter: Payer: Self-pay | Admitting: Internal Medicine

## 2014-02-20 ENCOUNTER — Telehealth: Payer: Self-pay | Admitting: Internal Medicine

## 2014-02-20 NOTE — Telephone Encounter (Signed)
States pt has called her insurance disputing labs done with [facility]  From visit 12/18/13.  Insurance calling to question the labs.

## 2014-02-21 NOTE — Telephone Encounter (Signed)
Please advise 

## 2014-04-23 NOTE — Telephone Encounter (Signed)
I have sent this message to [facility].

## 2015-04-30 ENCOUNTER — Encounter: Payer: Self-pay | Admitting: Family Medicine

## 2015-04-30 ENCOUNTER — Ambulatory Visit (INDEPENDENT_AMBULATORY_CARE_PROVIDER_SITE_OTHER): Payer: BLUE CROSS/BLUE SHIELD | Admitting: Family Medicine

## 2015-04-30 VITALS — BP 114/68 | HR 83 | Temp 98.6°F | Ht 64.25 in | Wt 156.0 lb

## 2015-04-30 DIAGNOSIS — R079 Chest pain, unspecified: Secondary | ICD-10-CM | POA: Diagnosis not present

## 2015-04-30 DIAGNOSIS — R110 Nausea: Secondary | ICD-10-CM | POA: Diagnosis not present

## 2015-04-30 LAB — COMPREHENSIVE METABOLIC PANEL
ALT: 16 U/L (ref 0–35)
AST: 23 U/L (ref 0–37)
Albumin: 4.2 g/dL (ref 3.5–5.2)
Alkaline Phosphatase: 82 U/L (ref 39–117)
BILIRUBIN TOTAL: 0.4 mg/dL (ref 0.2–1.2)
BUN: 16 mg/dL (ref 6–23)
CALCIUM: 9.8 mg/dL (ref 8.4–10.5)
CHLORIDE: 104 meq/L (ref 96–112)
CO2: 28 meq/L (ref 19–32)
CREATININE: 0.65 mg/dL (ref 0.40–1.20)
GFR: 98.55 mL/min (ref >60.00)
GLUCOSE: 96 mg/dL (ref 70–99)
Potassium: 4.4 mEq/L (ref 3.5–5.1)
SODIUM: 140 meq/L (ref 135–145)
Total Protein: 6.7 g/dL (ref 6.0–8.3)

## 2015-04-30 LAB — CBC
HEMATOCRIT: 38.7 % (ref 36.0–46.0)
HEMOGLOBIN: 12.9 g/dL (ref 12.0–15.0)
MCHC: 33.3 g/dL (ref 30.0–36.0)
MCV: 93.3 fl (ref 78.0–100.0)
Platelets: 188 10*3/uL (ref 150.0–400.0)
RBC: 4.14 Mil/uL (ref 3.87–5.11)
RDW: 13.4 % (ref 11.5–15.5)
WBC: 3.7 10*3/uL — AB (ref 4.0–10.5)

## 2015-04-30 LAB — TROPONIN I: TNIDX: 0 ug/l (ref 0.00–0.06)

## 2015-04-30 MED ORDER — OMEPRAZOLE 20 MG PO CPDR: 20.0000 mg | DELAYED_RELEASE_CAPSULE | Freq: Every day | ORAL | Status: DC

## 2015-04-30 MED ORDER — ONDANSETRON HCL 4 MG PO TABS: 4.0000 mg | ORAL_TABLET | Freq: Three times a day (TID) | ORAL | Status: DC | PRN

## 2015-04-30 NOTE — Assessment & Plan Note (Signed)
EKG obtained and interpreted personally today. No signs of ischemia. Suspect MSK vs GI source. Patient to go back on omeprazole for GERD. Obtaining labs today: CBC, CMP, troponin. Patient to follow up closely with PCP.

## 2015-04-30 NOTE — Progress Notes (Signed)
Pre visit review using our clinic review tool, if applicable. No additional management support is needed unless otherwise documented below in the visit note. 

## 2015-04-30 NOTE — Patient Instructions (Signed)
Please restart your Omeprazole. I sent in an Rx today.  We will call with your lab results.  Follow up if you fail to improve or worsens.  Take care  Dr. Lacinda Axon

## 2015-04-30 NOTE — Progress Notes (Signed)
Subjective:  Patient ID: Melanie Ward, female    DOB: [birth year]  Age: 59 y.o. MRN: [id number]  CC: Nausea, chest pain  HPI:  59 year old female presents to clinic today for an acute visit with complaints of nausea and recent chest pain.  Patient reports that she is been experiencing intermittent nausea for the past month.  She states that she recently changed her diet (she is limited in sugar and gluten). She reports that the nausea recently worsened as of yesterday and has been constant since then. She denies any associated vomiting. No reports of diarrhea or constipation.  Additionally, patient reports that she developed left-sided chest pain yesterday. She reports the pain is constant and has been persistent since yesterday.   She describes the chest pain as a "tightness". Severity: 6/10. No associated with exertion. No associated diaphoresis. No known exacerbating or relieving factors. No radiation.   Social Hx   Social History   Social History  . Marital Status: Married    Spouse Name: N/A  . Number of Children: N/A  . Years of Education: N/A   Social History Main Topics  . Smoking status: Never Smoker   . Smokeless tobacco: Never Used  . Alcohol Use: No  . Drug Use: No  . Sexual Activity: Yes    Birth Control/ Protection: Post-menopausal   Other Topics Concern  . None   Social History Narrative   Review of Systems  Respiratory: Positive for chest tightness. Negative for shortness of breath.   Gastrointestinal: Positive for nausea. Negative for vomiting, abdominal pain, diarrhea and constipation.   Objective:  BP 114/68 mmHg  Pulse 83  Temp(Src) 98.6 F (37 C) (Oral)  Ht 5' 4.25" (1.632 m)  Wt 156 lb (70.761 kg)  BMI 26.57 kg/m2  SpO2 95%  BP/Weight 04/30/2015 11/20/8889 01/21/4502  Systolic BP 888 280 034  Diastolic BP 68 68 60  Wt. (Lbs) 156 159.5 159  BMI 26.57 27.16 27.28   Physical Exam  Constitutional: She is oriented to person, place, and time. She  appears well-developed and well-nourished. No distress.  HENT:  Mouth/Throat: Oropharynx is clear and moist.  Cardiovascular: Normal rate and regular rhythm.   No murmur heard. Pulmonary/Chest: No respiratory distress. She has no wheezes. She has no rales.  + Left sided chest wall tenderness to palpation.  Abdominal: Soft. She exhibits no distension. There is no tenderness. There is no rebound.  Neurological: She is alert and oriented to person, place, and time.  Psychiatric:  Flat affect.   Vitals reviewed.  Lab Results  Component Value Date   WBC 5.4 08/19/2013   HGB 12.6 08/19/2013   HCT 37.2 08/19/2013   PLT 200.0 08/19/2013   GLUCOSE 99 08/19/2013   CHOL 217* 06/20/2013   TRIG 91.0 06/20/2013   HDL 61.30 06/20/2013   LDLDIRECT 135.6 06/20/2013   ALT 24 08/19/2013   AST 28 08/19/2013   NA 138 08/19/2013   K 4.5 08/19/2013   CL 104 08/19/2013   CREATININE 0.7 08/19/2013   BUN 16 08/19/2013   CO2 28 08/19/2013   TSH 0.86 12/18/2013   HGBA1C 5.9 06/20/2013   EKG - Normal Sinus rhythm. No ST or T wave changes; no signs of ischemia.   Assessment & Plan:   Problem List Items Addressed This Visit    Chest pain - Primary    EKG obtained and interpreted personally today. No signs of ischemia. Suspect MSK vs GI source. Patient to go back on omeprazole for  GERD. Obtaining labs today: CBC, CMP, troponin. Patient to follow up closely with PCP.      Relevant Orders   EKG 12-Lead (Completed)   Comp Met (CMET)   CBC   Troponin I   Nausea without vomiting    Unclear etiology. Unremarkable exam other than for left chest wall tenderness. Treating with Zofran. Patient to resume Omeprazole for GERD.      Relevant Orders   Comp Met (CMET)   CBC      Meds ordered this encounter  Medications  . omeprazole (PRILOSEC) 20 MG capsule    Sig: Take 1 capsule (20 mg total) by mouth daily.    Dispense:  30 capsule    Refill:  3  . ondansetron (ZOFRAN) 4 MG tablet    Sig:  Take 1 tablet (4 mg total) by mouth every 8 (eight) hours as needed for nausea or vomiting.    Dispense:  20 tablet    Refill:  0    Follow-up: PRN  Thersa Salt, DO

## 2015-04-30 NOTE — Assessment & Plan Note (Signed)
Unclear etiology. Unremarkable exam other than for left chest wall tenderness. Treating with Zofran. Patient to resume Omeprazole for GERD.

## 2015-05-15 ENCOUNTER — Ambulatory Visit
Admission: RE | Admit: 2015-05-15 | Discharge: 2015-05-15 | Disposition: A | Discharge status: Home / Self Care | Payer: BLUE CROSS/BLUE SHIELD | Source: Ambulatory Visit | Attending: Internal Medicine | Admitting: Internal Medicine

## 2015-05-15 ENCOUNTER — Encounter: Payer: Self-pay | Admitting: Internal Medicine

## 2015-05-15 ENCOUNTER — Ambulatory Visit (INDEPENDENT_AMBULATORY_CARE_PROVIDER_SITE_OTHER): Payer: BLUE CROSS/BLUE SHIELD | Admitting: Internal Medicine

## 2015-05-15 ENCOUNTER — Other Ambulatory Visit (HOSPITAL_COMMUNITY)
Admission: RE | Admit: 2015-05-15 | Discharge: 2015-05-15 | Disposition: A | Discharge status: Home / Self Care | Payer: BLUE CROSS/BLUE SHIELD | Source: Ambulatory Visit | Attending: Internal Medicine | Admitting: Internal Medicine

## 2015-05-15 VITALS — BP 108/74 | HR 82 | Temp 98.4°F | Resp 12 | Ht 64.0 in | Wt 154.0 lb

## 2015-05-15 DIAGNOSIS — Z124 Encounter for screening for malignant neoplasm of cervix: Secondary | ICD-10-CM | POA: Diagnosis not present

## 2015-05-15 DIAGNOSIS — Z1239 Encounter for other screening for malignant neoplasm of breast: Secondary | ICD-10-CM | POA: Diagnosis not present

## 2015-05-15 DIAGNOSIS — Z0000 Encounter for general adult medical examination without abnormal findings: Secondary | ICD-10-CM | POA: Diagnosis not present

## 2015-05-15 DIAGNOSIS — E559 Vitamin D deficiency, unspecified: Secondary | ICD-10-CM

## 2015-05-15 DIAGNOSIS — R0789 Other chest pain: Secondary | ICD-10-CM

## 2015-05-15 DIAGNOSIS — Z01419 Encounter for gynecological examination (general) (routine) without abnormal findings: Secondary | ICD-10-CM | POA: Diagnosis present

## 2015-05-15 DIAGNOSIS — Z1151 Encounter for screening for human papillomavirus (HPV): Secondary | ICD-10-CM | POA: Insufficient documentation

## 2015-05-15 DIAGNOSIS — Z113 Encounter for screening for infections with a predominantly sexual mode of transmission: Secondary | ICD-10-CM

## 2015-05-15 LAB — VITAMIN D 25 HYDROXY (VIT D DEFICIENCY, FRACTURES): VITD: 34.17 ng/mL (ref 30.00–100.00)

## 2015-05-15 LAB — CK: Total CK: 124 U/L (ref 7–177)

## 2015-05-15 NOTE — Patient Instructions (Addendum)
I have ordered rib films on the left to evaluate your chest wall pain  Repeat labs today   If normal,  We will evaluate your pancreas and gallbladder just to be sure the pain is not coming from them.   Your PAP smears was done today; your pelvic and breast exams were normal.  Your next colonoscopy is due in 2018  Health Maintenance Adopting a healthy lifestyle and getting preventive care can go a long way to promote health and wellness. Talk with your health care provider about what schedule of regular examinations is right for you. This is a good chance for you to check in with your provider about disease prevention and staying healthy. In between checkups, there are plenty of things you can do on your own. Experts have done a lot of research about which lifestyle changes and preventive measures are most likely to keep you healthy. Ask your health care provider for more information. WEIGHT AND DIET  Eat a healthy diet  Be sure to include plenty of vegetables, fruits, low-fat dairy products, and lean protein.  Do not eat a lot of foods high in solid fats, added sugars, or salt.  Get regular exercise. This is one of the most important things you can do for your health.  Most adults should exercise for at least 150 minutes each week. The exercise should increase your heart rate and make you sweat (moderate-intensity exercise).  Most adults should also do strengthening exercises at least twice a week. This is in addition to the moderate-intensity exercise.  Maintain a healthy weight  Body mass index (BMI) is a measurement that can be used to identify possible weight problems. It estimates body fat based on height and weight. Your health care provider can help determine your BMI and help you achieve or maintain a healthy weight.  For females 73 years of age and older:   A BMI below 18.5 is considered underweight.  A BMI of 18.5 to 24.9 is normal.  A BMI of 25 to 29.9 is considered  overweight.  A BMI of 30 and above is considered obese.  Watch levels of cholesterol and blood lipids  You should start having your blood tested for lipids and cholesterol at 61 years of age, then have this test every 5 years.  You may need to have your cholesterol levels checked more often if:  Your lipid or cholesterol levels are high.  You are older than 61 years of age.  You are at high risk for heart disease.  CANCER SCREENING   Lung Cancer  Lung cancer screening is recommended for adults 22-56 years old who are at high risk for lung cancer because of a history of smoking.  A yearly low-dose CT scan of the lungs is recommended for people who:  Currently smoke.  Have quit within the past 15 years.  Have at least a 30-pack-year history of smoking. A pack year is smoking an average of one pack of cigarettes a day for 1 year.  Yearly screening should continue until it has been 15 years since you quit.  Yearly screening should stop if you develop a health problem that would prevent you from having lung cancer treatment.  Breast Cancer  Practice breast self-awareness. This means understanding how your breasts normally appear and feel.  It also means doing regular breast self-exams. Let your health care provider know about any changes, no matter how small.  If you are in your 20s or 30s, you should  have a clinical breast exam (CBE) by a health care provider every 1-3 years as part of a regular health exam.  If you are 38 or older, have a CBE every year. Also consider having a breast X-ray (mammogram) every year.  If you have a family history of breast cancer, talk to your health care provider about genetic screening.  If you are at high risk for breast cancer, talk to your health care provider about having an MRI and a mammogram every year.  Breast cancer gene (BRCA) assessment is recommended for women who have family members with BRCA-related cancers. BRCA-related  cancers include:  Breast.  Ovarian.  Tubal.  Peritoneal cancers.  Results of the assessment will determine the need for genetic counseling and BRCA1 and BRCA2 testing. Cervical Cancer Routine pelvic examinations to screen for cervical cancer are no longer recommended for nonpregnant women who are considered low risk for cancer of the pelvic organs (ovaries, uterus, and vagina) and who do not have symptoms. A pelvic examination may be necessary if you have symptoms including those associated with pelvic infections. Ask your health care provider if a screening pelvic exam is right for you.   The Pap test is the screening test for cervical cancer for women who are considered at risk.  If you had a hysterectomy for a problem that was not cancer or a condition that could lead to cancer, then you no longer need Pap tests.  If you are older than 65 years, and you have had normal Pap tests for the past 10 years, you no longer need to have Pap tests.  If you have had past treatment for cervical cancer or a condition that could lead to cancer, you need Pap tests and screening for cancer for at least 20 years after your treatment.  If you no longer get a Pap test, assess your risk factors if they change (such as having a new sexual partner). This can affect whether you should start being screened again.  Some women have medical problems that increase their chance of getting cervical cancer. If this is the case for you, your health care provider may recommend more frequent screening and Pap tests.  The human papillomavirus (HPV) test is another test that may be used for cervical cancer screening. The HPV test looks for the virus that can cause cell changes in the cervix. The cells collected during the Pap test can be tested for HPV.  The HPV test can be used to screen women 61 years of age and older. Getting tested for HPV can extend the interval between normal Pap tests from three to five  years.  An HPV test also should be used to screen women of any age who have unclear Pap test results.  After 61 years of age, women should have HPV testing as often as Pap tests.  Colorectal Cancer  This type of cancer can be detected and often prevented.  Routine colorectal cancer screening usually begins at 61 years of age and continues through 61 years of age.  Your health care provider may recommend screening at an earlier age if you have risk factors for colon cancer.  Your health care provider may also recommend using home test kits to check for hidden blood in the stool.  A small camera at the end of a tube can be used to examine your colon directly (sigmoidoscopy or colonoscopy). This is done to check for the earliest forms of colorectal cancer.  Routine screening usually  begins at age 30.  Direct examination of the colon should be repeated every 5-10 years through 61 years of age. However, you may need to be screened more often if early forms of precancerous polyps or small growths are found. Skin Cancer  Check your skin from head to toe regularly.  Tell your health care provider about any new moles or changes in moles, especially if there is a change in a mole's shape or color.  Also tell your health care provider if you have a mole that is larger than the size of a pencil eraser.  Always use sunscreen. Apply sunscreen liberally and repeatedly throughout the day.  Protect yourself by wearing long sleeves, pants, a wide-brimmed hat, and sunglasses whenever you are outside. HEART DISEASE, DIABETES, AND HIGH BLOOD PRESSURE   Have your blood pressure checked at least every 1-2 years. High blood pressure causes heart disease and increases the risk of stroke.  If you are between 39 years and 41 years old, ask your health care provider if you should take aspirin to prevent strokes.  Have regular diabetes screenings. This involves taking a blood sample to check your fasting  blood sugar level.  If you are at a normal weight and have a low risk for diabetes, have this test once every three years after 61 years of age.  If you are overweight and have a high risk for diabetes, consider being tested at a younger age or more often. PREVENTING INFECTION  Hepatitis B  If you have a higher risk for hepatitis B, you should be screened for this virus. You are considered at high risk for hepatitis B if:  You were born in a country where hepatitis B is common. Ask your health care provider which countries are considered high risk.  Your parents were born in a high-risk country, and you have not been immunized against hepatitis B (hepatitis B vaccine).  You have HIV or AIDS.  You use needles to inject street drugs.  You live with someone who has hepatitis B.  You have had sex with someone who has hepatitis B.  You get hemodialysis treatment.  You take certain medicines for conditions, including cancer, organ transplantation, and autoimmune conditions. Hepatitis C  Blood testing is recommended for:  Everyone born from [birth year] through 1967.  Anyone with known risk factors for hepatitis C. Sexually transmitted infections (STIs)  You should be screened for sexually transmitted infections (STIs) including gonorrhea and chlamydia if:  You are sexually active and are younger than 61 years of age.  You are older than 61 years of age and your health care provider tells you that you are at risk for this type of infection.  Your sexual activity has changed since you were last screened and you are at an increased risk for chlamydia or gonorrhea. Ask your health care provider if you are at risk.  If you do not have HIV, but are at risk, it may be recommended that you take a prescription medicine daily to prevent HIV infection. This is called pre-exposure prophylaxis (PrEP). You are considered at risk if:  You are sexually active and do not regularly use condoms or know  the HIV status of your partner(s).  You take drugs by injection.  You are sexually active with a partner who has HIV. Talk with your health care provider about whether you are at high risk of being infected with HIV. If you choose to begin PrEP, you should first be tested for  HIV. You should then be tested every 3 months for as long as you are taking PrEP.  PREGNANCY   If you are premenopausal and you may become pregnant, ask your health care provider about preconception counseling.  If you may become pregnant, take 400 to 800 micrograms (mcg) of folic acid every day.  If you want to prevent pregnancy, talk to your health care provider about birth control (contraception). OSTEOPOROSIS AND MENOPAUSE   Osteoporosis is a disease in which the bones lose minerals and strength with aging. This can result in serious bone fractures. Your risk for osteoporosis can be identified using a bone density scan.  If you are 55 years of age or older, or if you are at risk for osteoporosis and fractures, ask your health care provider if you should be screened.  Ask your health care provider whether you should take a calcium or vitamin D supplement to lower your risk for osteoporosis.  Menopause may have certain physical symptoms and risks.  Hormone replacement therapy may reduce some of these symptoms and risks. Talk to your health care provider about whether hormone replacement therapy is right for you.  HOME CARE INSTRUCTIONS   Schedule regular health, dental, and eye exams.  Stay current with your immunizations.   Do not use any tobacco products including cigarettes, chewing tobacco, or electronic cigarettes.  If you are pregnant, do not drink alcohol.  If you are breastfeeding, limit how much and how often you drink alcohol.  Limit alcohol intake to no more than 1 drink per day for nonpregnant women. One drink equals 12 ounces of beer, 5 ounces of wine, or 1 ounces of hard liquor.  Do not  use street drugs.  Do not share needles.  Ask your health care provider for help if you need support or information about quitting drugs.  Tell your health care provider if you often feel depressed.  Tell your health care provider if you have ever been abused or do not feel safe at home. Document Released: 02/14/2011 Document Revised: 12/16/2013 Document Reviewed: 07/03/2013 [facility] Patient Information 2015 [place], [place]. This information is not intended to replace advice given to you by your health care provider. Make sure you discuss any questions you have with your health care provider.

## 2015-05-15 NOTE — Progress Notes (Signed)
Pre-visit discussion using our clinic review tool. No additional management support is needed unless otherwise documented below in the visit note.  

## 2015-05-15 NOTE — Assessment & Plan Note (Addendum)
Etiology unclear.  Checking CK, rib films. If normal will evaluate GB and pancreas.

## 2015-05-15 NOTE — Progress Notes (Signed)
Patient ID: Melanie Ward, female    DOB: [birth year]  Age: 59 y.o. MRN: [id number]  The patient is here for annual  wellness examination and management of other chronic and acute problems.   The risk factors are reflected in the social history.  The roster of all physicians providing medical care to patient - is listed in the Snapshot section of the chart.  Home safety : The patient has smoke detectors in the home. They wear seatbelts.  There are no firearms at home. There is no violence in the home.   There is no risks for hepatitis, STDs or HIV. There is no   history of blood transfusion. They have no travel history to infectious disease endemic areas of the world.  The patient has seen their dentist in the last six month. They have seen their eye doctor in the last year. They admit to slight hearing difficulty with regard to whispered voices and some television programs.  They have deferred audiologic testing in the last year.  They do not  have excessive sun exposure. Discussed the need for sun protection: hats, long sleeves and use of sunscreen if there is significant sun exposure.   Diet: the importance of a healthy diet is discussed. They do have a healthy diet.  The benefits of regular aerobic exercise were discussed. She walks 4 times per week ,  20 minutes.   Depression screen: there are no signs or vegative symptoms of depression- irritability, change in appetite, anhedonia, sadness/tearfullness.  The following portions of the patient's history were reviewed and updated as appropriate: allergies, current medications, past family history, past medical history,  past surgical history, past social history  and problem list.  Visual acuity was not assessed per patient preference since she has regular follow up with her ophthalmologist. Hearing and body mass index were assessed and reviewed.   During the course of the visit the patient was educated and counseled about appropriate  screening and preventive services including : fall prevention , diabetes screening, nutrition counseling, colorectal cancer screening, and recommended immunizations.    CC: The primary encounter diagnosis was Left-sided chest wall pain. Diagnoses of Breast cancer screening, Screening for STD (sexually transmitted disease), Vitamin D deficiency, Screening for cervical cancer, and Encounter for preventive health examination were also pertinent to this visit.  Seen two weeks for for chest pain,  nausea and GERD meds resumed after being evaluated  for ischemia with EKG and enzymes were normal .  The feeling is not pain but  A burning sensation in her left breast that radiates to the scapular area that has not abated.  Nausea is gone without use of odansetron.    No loose stools  Some constipation   Was exercising daily until 6 weeks ago,  No chest pain or shortness of breath during class.   Has been diagnosed with  Elevated lead and mercury levls by an integrative medicine clinic in [place].  Was prescribed EDTA but has not started taiking it. Has well water.  No symptoms of lead or mercury toxicity  Constipation lasted several days while at the beach.  ,  History Melanie Ward has a past medical history of History of chicken pox; GERD (gastroesophageal reflux disease); Cardiac arrhythmia; and Endometriosis (1992).   She has past surgical history that includes Tonsillectomy and adenoidectomy (1964); Laparoscopic endometriosis fulguration (1992); and Appendectomy (1973).   Her family history includes Cancer in her mother; Diabetes in her maternal grandmother; Heart disease in her father;  Hypertension in her mother.She reports that she has never smoked. She has never used smokeless tobacco. She reports that she does not drink alcohol or use illicit drugs.  Outpatient Prescriptions Prior to Visit  Medication Sig Dispense Refill  . Cholecalciferol (VITAMIN D) 2000 UNITS CAPS Take by mouth.    . fish oil-omega-3 fatty  acids 1000 MG capsule Take 2 g by mouth daily.    . Multiple Vitamin (MULTIVITAMIN) tablet Take 1 tablet by mouth daily.    Marland Kitchen omeprazole (PRILOSEC) 20 MG capsule Take 1 capsule (20 mg total) by mouth daily. 30 capsule 3  . ondansetron (ZOFRAN) 4 MG tablet Take 1 tablet (4 mg total) by mouth every 8 (eight) hours as needed for nausea or vomiting. (Patient not taking: Reported on 05/15/2015) 20 tablet 0  . ranitidine (ZANTAC) 150 MG tablet Take 1 tablet (150 mg total) by mouth 2 (two) times daily. (Patient not taking: Reported on 04/30/2015) 60 tablet 0   No facility-administered medications prior to visit.    Review of Systems   Patient denies headache, fevers, malaise, unintentional weight loss, skin rash, eye pain, sinus congestion and sinus pain, sore throat, dysphagia,  hemoptysis , cough, dyspnea, wheezing, chest pain, palpitations, orthopnea, edema, abdominal pain, nausea, melena, diarrhea, constipation, flank pain, dysuria, hematuria, urinary  Frequency, nocturia, numbness, tingling, seizures,  Focal weakness, Loss of consciousness,  Tremor, insomnia, depression, anxiety, and suicidal ideation.      Objective:  BP 108/74 mmHg  Pulse 82  Temp(Src) 98.4 F (36.9 C) (Oral)  Resp 12  Ht 5\' 4"  (1.626 m)  Wt 154 lb (69.854 kg)  BMI 26.42 kg/m2  SpO2 98%  Physical Exam  General Appearance:    Alert, cooperative, no distress, appears stated age  Head:    Normocephalic, without obvious abnormality, atraumatic  Eyes:    PERRL, conjunctiva/corneas clear, EOM's intact, fundi    benign, both eyes  Ears:    Normal TM's and external ear canals, both ears  Nose:   Nares normal, septum midline, mucosa normal, no drainage    or sinus tenderness  Throat:   Lips, mucosa, and tongue normal; teeth and gums normal  Neck:   Supple, symmetrical, trachea midline, no adenopathy;    thyroid:  no enlargement/tenderness/nodules; no carotid   bruit or JVD  Back:     Symmetric, no curvature, ROM normal,  no CVA tenderness  Lungs:     Clear to auscultation bilaterally, respirations unlabored  Chest Wall:    Tender in the left mid axillary line,     Heart:    Regular rate and rhythm, S1 and S2 normal, no murmur, rub   or gallop  Breast Exam:    No tenderness, masses, or nipple abnormality  Abdomen:     Soft, non-tender, bowel sounds active all four quadrants,    no masses, no organomegaly  Genitalia:    Pelvic: cervix normal in appearance, external genitalia normal, no adnexal masses or tenderness, no cervical motion tenderness, rectovaginal septum normal, uterus normal size, shape, and consistency and vagina normal without discharge  Extremities:   Extremities normal, atraumatic, no cyanosis or edema  Pulses:   2+ and symmetric all extremities  Skin:   Skin color, texture, turgor normal, no rashes or lesions  Lymph nodes:   Cervical, supraclavicular, and axillary nodes normal  Neurologic:   CNII-XII intact, normal strength, sensation and reflexes    throughout     Assessment & Plan:   Problem List Items Addressed  This Visit    Encounter for preventive health examination    Annual comprehensive preventive exam was done as well as an evaluation and management of chronic conditions .  During the course of the visit the patient was educated and counseled about appropriate screening and preventive services including :  diabetes screening, lipid analysis with projected  10 year  risk for CAD  Which is 4.7 % using the Framingham risk calculator for women, , nutrition counseling, colorectal cancer screening, and recommended immunizations.  Printed recommendations for health maintenance screenings was given.       Left-sided chest wall pain - Primary    Etiology unclear.  Checking CK, rib films. If normal will evaluate GB and pancreas.       Relevant Orders   DG Ribs Unilateral Left (Completed)   ANA w/Reflex if Positive   CK (Completed)    Other Visit Diagnoses    Breast cancer screening         Relevant Orders    MM DIGITAL SCREENING BILATERAL    Screening for STD (sexually transmitted disease)        Relevant Orders    HIV antibody    Hepatitis C antibody    Vitamin D deficiency        Relevant Orders    Vit D  25 hydroxy (rtn osteoporosis monitoring) (Completed)    Screening for cervical cancer        Relevant Orders    Cytology - PAP       I have discontinued Ms. Ringler's ranitidine and ondansetron. I am also having her maintain her multivitamin, fish oil-omega-3 fatty acids, Vitamin D, and omeprazole.  No orders of the defined types were placed in this encounter.    Medications Discontinued During This Encounter  Medication Reason  . ondansetron (ZOFRAN) 4 MG tablet Completed Course  . ranitidine (ZANTAC) 150 MG tablet Patient Preference    Follow-up: Return in about 4 weeks (around 06/12/2015).   Crecencio Mc, MD

## 2015-05-15 NOTE — Assessment & Plan Note (Signed)
Annual comprehensive preventive exam was done as well as an evaluation and management of chronic conditions .  During the course of the visit the patient was educated and counseled about appropriate screening and preventive services including :  diabetes screening, lipid analysis with projected  10 year  risk for CAD  Which is 4.7 % using the Framingham risk calculator for women, , nutrition counseling, colorectal cancer screening, and recommended immunizations.  Printed recommendations for health maintenance screenings was given.  

## 2015-05-16 LAB — HEPATITIS C ANTIBODY: HCV Ab: NEGATIVE

## 2015-05-17 ENCOUNTER — Encounter: Payer: Self-pay | Admitting: Internal Medicine

## 2015-05-18 LAB — HIV ANTIBODY (ROUTINE TESTING W REFLEX): HIV 1&2 Ab, 4th Generation: NONREACTIVE

## 2015-05-19 LAB — CYTOLOGY - PAP

## 2015-05-27 ENCOUNTER — Encounter: Payer: Self-pay | Admitting: Internal Medicine

## 2015-06-08 ENCOUNTER — Encounter: Payer: Self-pay | Admitting: Internal Medicine

## 2016-05-23 DIAGNOSIS — R5381 Other malaise: Secondary | ICD-10-CM | POA: Diagnosis not present

## 2016-05-23 DIAGNOSIS — E559 Vitamin D deficiency, unspecified: Secondary | ICD-10-CM | POA: Diagnosis not present

## 2016-05-23 DIAGNOSIS — A6920 Lyme disease, unspecified: Secondary | ICD-10-CM | POA: Diagnosis not present

## 2016-05-23 DIAGNOSIS — B2790 Infectious mononucleosis, unspecified without complication: Secondary | ICD-10-CM | POA: Diagnosis not present

## 2016-05-23 DIAGNOSIS — E119 Type 2 diabetes mellitus without complications: Secondary | ICD-10-CM | POA: Diagnosis not present

## 2016-07-12 DIAGNOSIS — L300 Nummular dermatitis: Secondary | ICD-10-CM | POA: Diagnosis not present

## 2016-09-02 DIAGNOSIS — Z803 Family history of malignant neoplasm of breast: Secondary | ICD-10-CM | POA: Diagnosis not present

## 2016-09-02 DIAGNOSIS — Z1231 Encounter for screening mammogram for malignant neoplasm of breast: Secondary | ICD-10-CM | POA: Diagnosis not present

## 2016-09-02 LAB — HM MAMMOGRAPHY

## 2016-09-05 ENCOUNTER — Encounter: Payer: Self-pay | Admitting: Internal Medicine

## 2016-11-07 DIAGNOSIS — H40003 Preglaucoma, unspecified, bilateral: Secondary | ICD-10-CM | POA: Diagnosis not present

## 2017-01-31 DIAGNOSIS — R5381 Other malaise: Secondary | ICD-10-CM | POA: Diagnosis not present

## 2017-01-31 DIAGNOSIS — E039 Hypothyroidism, unspecified: Secondary | ICD-10-CM | POA: Diagnosis not present

## 2017-01-31 DIAGNOSIS — F419 Anxiety disorder, unspecified: Secondary | ICD-10-CM | POA: Diagnosis not present

## 2017-01-31 DIAGNOSIS — A6920 Lyme disease, unspecified: Secondary | ICD-10-CM | POA: Diagnosis not present

## 2017-01-31 DIAGNOSIS — E119 Type 2 diabetes mellitus without complications: Secondary | ICD-10-CM | POA: Diagnosis not present

## 2017-01-31 DIAGNOSIS — E559 Vitamin D deficiency, unspecified: Secondary | ICD-10-CM | POA: Diagnosis not present

## 2017-01-31 DIAGNOSIS — B2790 Infectious mononucleosis, unspecified without complication: Secondary | ICD-10-CM | POA: Diagnosis not present

## 2017-02-09 DIAGNOSIS — R5383 Other fatigue: Secondary | ICD-10-CM | POA: Diagnosis not present

## 2017-02-09 DIAGNOSIS — A6920 Lyme disease, unspecified: Secondary | ICD-10-CM | POA: Diagnosis not present

## 2017-02-09 DIAGNOSIS — R5381 Other malaise: Secondary | ICD-10-CM | POA: Diagnosis not present

## 2017-02-09 DIAGNOSIS — F419 Anxiety disorder, unspecified: Secondary | ICD-10-CM | POA: Diagnosis not present

## 2017-05-10 NOTE — Telephone Encounter (Signed)
Error

## 2017-05-10 NOTE — Telephone Encounter (Signed)
Referral

## 2017-05-12 DIAGNOSIS — H40003 Preglaucoma, unspecified, bilateral: Secondary | ICD-10-CM | POA: Diagnosis not present

## 2017-05-30 ENCOUNTER — Ambulatory Visit (INDEPENDENT_AMBULATORY_CARE_PROVIDER_SITE_OTHER): Payer: BLUE CROSS/BLUE SHIELD | Admitting: Internal Medicine

## 2017-05-30 ENCOUNTER — Encounter: Payer: Self-pay | Admitting: Internal Medicine

## 2017-05-30 VITALS — BP 128/82 | HR 83 | Temp 98.1°F | Resp 15 | Ht 64.0 in | Wt 162.0 lb

## 2017-05-30 DIAGNOSIS — H6121 Impacted cerumen, right ear: Secondary | ICD-10-CM

## 2017-05-30 DIAGNOSIS — E559 Vitamin D deficiency, unspecified: Secondary | ICD-10-CM | POA: Diagnosis not present

## 2017-05-30 DIAGNOSIS — R635 Abnormal weight gain: Secondary | ICD-10-CM

## 2017-05-30 DIAGNOSIS — Z0000 Encounter for general adult medical examination without abnormal findings: Secondary | ICD-10-CM

## 2017-05-30 DIAGNOSIS — E663 Overweight: Secondary | ICD-10-CM

## 2017-05-30 DIAGNOSIS — R21 Rash and other nonspecific skin eruption: Secondary | ICD-10-CM

## 2017-05-30 DIAGNOSIS — R7303 Prediabetes: Secondary | ICD-10-CM

## 2017-05-30 HISTORY — DX: Impacted cerumen, right ear: H61.21

## 2017-05-30 LAB — CBC WITH DIFFERENTIAL/PLATELET
BASOS ABS: 0.1 10*3/uL (ref 0.0–0.1)
Basophils Relative: 1 % (ref 0.0–3.0)
EOS ABS: 0.1 10*3/uL (ref 0.0–0.7)
Eosinophils Relative: 2 % (ref 0.0–5.0)
HEMATOCRIT: 38.7 % (ref 36.0–46.0)
Hemoglobin: 12.6 g/dL (ref 12.0–15.0)
LYMPHS ABS: 2.2 10*3/uL (ref 0.7–4.0)
LYMPHS PCT: 44.3 % (ref 12.0–46.0)
MCHC: 32.6 g/dL (ref 30.0–36.0)
MCV: 96 fl (ref 78.0–100.0)
MONOS PCT: 7.9 % (ref 3.0–12.0)
Monocytes Absolute: 0.4 10*3/uL (ref 0.1–1.0)
NEUTROS ABS: 2.3 10*3/uL (ref 1.4–7.7)
NEUTROS PCT: 44.8 % (ref 43.0–77.0)
PLATELETS: 209 10*3/uL (ref 150.0–400.0)
RBC: 4.03 Mil/uL (ref 3.87–5.11)
RDW: 13.5 % (ref 11.5–15.5)
WBC: 5.1 10*3/uL (ref 4.0–10.5)

## 2017-05-30 LAB — SEDIMENTATION RATE: Sed Rate: 1 mm/hr (ref 0–30)

## 2017-05-30 LAB — TSH: TSH: 1.3 u[IU]/mL (ref 0.35–4.50)

## 2017-05-30 LAB — VITAMIN D 25 HYDROXY (VIT D DEFICIENCY, FRACTURES): VITD: 38.07 ng/mL (ref 30.00–100.00)

## 2017-05-30 LAB — HEMOGLOBIN A1C: HEMOGLOBIN A1C: 5.9 % (ref 4.6–6.5)

## 2017-05-30 MED ORDER — TRIAMCINOLONE ACETONIDE 0.1 % EX CREA: 1.0000 "application " | TOPICAL_CREAM | Freq: Two times a day (BID) | CUTANEOUS | 2 refills | Status: DC

## 2017-05-30 NOTE — Patient Instructions (Signed)
There are low carb "on the go" breakfast drinks and bars:  1) try the premixed protein drinks (Atkins, AdvantEdge and the best tasting , highest protein one available is called " Premier Protein"; it is advocated by the bariatric surgeons for their patients and  available of < $2 serving at [employer] and  In bulk for $1.50/serving at [employer] and [employer]  .    Nutritional analysis :  160 cal  30 g protein  1 g sugar 50% calcium needs   Vladimir Faster and [employer]   2) There are plenty of high protein  HIGH  carb cookies,  But only the following are low carb."  Look for them in the diet section of most grocery stores or vitamin shops,  where the protein shakes are  sold.   All of these have 5 g sugar varieties if you read the label  : Power crunch Atkins bars KIND : make sure you find the  "low glycemic index"  variety QUEST : (taste better after being microwaved for 5 sec OUT OF THE WRAPPER)     To make a low carb chip :  Take the Joseph's Lavash or Pita bread,  Or the [place] Low carb whole wheat tortilla   Place on metal cookie sheet  Brush with olive oil  Sprinkle garlic powder (NOT garlic salt), grated parmesan cheese, mediterranean seasoning , or all of them?  Bake at 275 for 30 minutes   We have substitutions for your potatoes!!  Try the mashed cauliflower and riced cauliflower dishes instead of rice and mashed potatoes  Mashed turnips are also very low carb!   For desserts :  Try the Dannon Lt n Fit greek yogurt dessert flavors and top with reddi Whip .  8 carbs,  80 calories  Try Oikos Triple Zero [place] Yogurt in the salted caramel, and the coffee flavors  With Whipped Cream for dessert  breyer's low carb ice cream, available in bars (on a stick, better ) or scoopable ice cream  HERE ARE THE LOW CARB  BREAD CHOICES

## 2017-05-30 NOTE — Progress Notes (Signed)
Patient ID: Melanie Ward, female    DOB: [birth year]  Age: 61 y.o. MRN: [id number]  The patient is here for annual Medicare wellness examination and management of other chronic and acute problems.  eclines flu vaccine  PAP 2016 normal Mammogram normal Melanie Ward 2018 Eye exan every 6  Months   Discussed cologuard as she is due,   Wants dexa  No fractures Mother did not have osteoporosis to her knowledge      The risk factors are reflected in the social history.  The roster of all physicians providing medical care to patient - is listed in the Snapshot section of the chart.  Activities of daily living:  The patient is 100% independent in all ADLs: dressing, toileting, feeding as well as independent mobility  Home safety : The patient has smoke detectors in the home. They wear seatbelts.  There are no firearms at home. There is no violence in the home.   There is no risks for hepatitis, STDs or HIV. There is no   history of blood transfusion. They have no travel history to infectious disease endemic areas of the world.  The patient has seen their dentist in the last six month. They have seen their eye doctor in the last year. They admit to slight hearing difficulty with regard to whispered voices and some television programs.  They have deferred audiologic testing in the last year.  They do not  have excessive sun exposure. Discussed the need for sun protection: hats, long sleeves and use of sunscreen if there is significant sun exposure.   Diet: the importance of a healthy diet is discussed. They do have a healthy diet.  The benefits of regular aerobic exercise were discussed. She walks 4 times per week ,  20 minutes.   Depression screen: there are no signs or vegative symptoms of depression- irritability, change in appetite, anhedonia, sadness/tearfullness.  Cognitive assessment: the patient manages all their financial and personal affairs and is actively engaged. They could relate  day,date,year and events; recalled 2/3 objects at 3 minutes; performed clock-face test normally.  The following portions of the patient's history were reviewed and updated as appropriate: allergies, current medications, past family history, past medical history,  past surgical history, past social history  and problem list.  Visual acuity was not assessed per patient preference since she has regular follow up with her ophthalmologist. Hearing and body mass index were assessed and reviewed.   During the course of the visit the patient was educated and counseled about appropriate screening and preventive services including : fall prevention , diabetes screening, nutrition counseling, colorectal cancer screening, and recommended immunizations.    CC: The primary encounter diagnosis was Rash and nonspecific skin eruption. Diagnoses of Prediabetes, Vitamin D deficiency, Weight gain, Hearing loss due to cerumen impaction, right, Encounter for preventive health examination, and Overweight (BMI 25.0-29.9) were also pertinent to this visit.  Concern about developing SLE no symptoms positive titers I nthe pats   itch rash on ;eft flank hp x 2 nonths  not treated Treated for follicultitis by dermatology     History Melanie Ward has a past medical history of Cardiac arrhythmia; Endometriosis (1992); GERD (gastroesophageal reflux disease); and History of chicken pox.   She has a past surgical history that includes Tonsillectomy and adenoidectomy (1964); Laparoscopic endometriosis fulguration (1992); and Appendectomy (1973).   Her family history includes Cancer in her mother; Heart attack in her maternal grandmother; Heart disease in her father; Hypertension in her mother;  Stroke in her maternal grandfather.She reports that she has never smoked. She has never used smokeless tobacco. She reports that she does not drink alcohol or use drugs.  Outpatient Medications Prior to Visit  Medication Sig Dispense Refill  .  Cholecalciferol (VITAMIN D) 2000 UNITS CAPS Take by mouth.    . fish oil-omega-3 fatty acids 1000 MG capsule Take 2 g by mouth daily.    . Multiple Vitamin (MULTIVITAMIN) tablet Take 1 tablet by mouth daily.    Marland Kitchen omeprazole (PRILOSEC) 20 MG capsule Take 1 capsule (20 mg total) by mouth daily. (Patient not taking: Reported on 05/30/2017) 30 capsule 3   No facility-administered medications prior to visit.     Review of Systems   Patient denies headache, fevers, malaise, unintentional weight loss, skin rash, eye pain, sinus congestion and sinus pain, sore throat, dysphagia,  hemoptysis , cough, dyspnea, wheezing, chest pain, palpitations, orthopnea, edema, abdominal pain, nausea, melena, diarrhea, constipation, flank pain, dysuria, hematuria, urinary  Frequency, nocturia, numbness, tingling, seizures,  Focal weakness, Loss of consciousness,  Tremor, insomnia, depression, anxiety, and suicidal ideation.      Objective:  BP 128/82 (BP Location: Left Arm, Patient Position: Sitting, Cuff Size: Normal)   Pulse 83   Temp 98.1 F (36.7 C) (Oral)   Resp 15   Ht 5\' 4"  (1.626 m)   Wt 162 lb (73.5 kg)   SpO2 98%   BMI 27.81 kg/m   Physical Exam   General appearance: alert, cooperative and appears stated age Head: Normocephalic, without obvious abnormality, atraumatic Eyes: conjunctivae/corneas clear. PERRL, EOM's intact. Fundi benign. Ears: normal TM's and external ear canals both ears Nose: Nares normal. Septum midline. Mucosa normal. No drainage or sinus tenderness. Throat: lips, mucosa, and tongue normal; teeth and gums normal Neck: no adenopathy, no carotid bruit, no JVD, supple, symmetrical, trachea midline and thyroid not enlarged, symmetric, no tenderness/mass/nodules Lungs: clear to auscultation bilaterally Breasts: normal appearance, no masses or tenderness Heart: regular rate and rhythm, S1, S2 normal, no murmur, click, rub or gallop Abdomen: soft, non-tender; bowel sounds normal;  no masses,  no organomegaly Extremities: extremities normal, atraumatic, no cyanosis or edema Pulses: 2+ and symmetric Skin: Skin color, texture, turgor normal. No rashes or lesions Neurologic: Alert and oriented X 3, normal strength and tone. Normal symmetric reflexes. Normal coordination and gait.      Assessment & Plan:   Problem List Items Addressed This Visit    Encounter for preventive health examination    Annual comprehensive preventive exam was done as well as an evaluation and management of chronic conditions .  During the course of the visit the patient was educated and counseled about appropriate screening and preventive services including :  diabetes screening, lipid analysis with projected  10 year  risk for CAD , nutrition counseling, breast, cervical and colorectal cancer screening, and recommended immunizations.  Printed recommendations for health maintenance screenings was given      Hearing loss due to cerumen impaction, right    Right ear canal is occluded with wax,  Irrigation needed       Overweight (BMI 25.0-29.9)    I have addressed  BMI and recommended wt loss of 10% of body weight over the next 6 months using a low fat, low starch, high protein  fruit/vegetable based Mediterranean diet and 30 minutes of aerobic exercise a minimum of 5 days per week.        Rash and nonspecific skin eruption - Primary   Relevant  Orders   CBC with Differential/Platelet (Completed)   Sedimentation rate (Completed)    Other Visit Diagnoses    Prediabetes       Relevant Orders   Lipid panel (Completed)   Hemoglobin A1c (Completed)   Comprehensive metabolic panel (Completed)   Vitamin D deficiency       Relevant Orders   VITAMIN D 25 Hydroxy (Vit-D Deficiency, Fractures) (Completed)   Weight gain       Relevant Orders   TSH (Completed)      I have discontinued Ms. Aguilera's omeprazole. I am also having her start on triamcinolone cream. Additionally, I am having her  maintain her multivitamin, fish oil-omega-3 fatty acids, and Vitamin D.  Meds ordered this encounter  Medications  . triamcinolone cream (KENALOG) 0.1 %    Sig: Apply 1 application topically 2 (two) times daily.    Dispense:  80 g    Refill:  2    Medications Discontinued During This Encounter  Medication Reason  . omeprazole (PRILOSEC) 20 MG capsule Patient has not taken in last 30 days    Follow-up: No Follow-up on file.   Crecencio Mc, MD

## 2017-05-31 LAB — COMPREHENSIVE METABOLIC PANEL
ALT: 17 U/L (ref 0–35)
AST: 23 U/L (ref 0–37)
Albumin: 4.5 g/dL (ref 3.5–5.2)
Alkaline Phosphatase: 99 U/L (ref 39–117)
BUN: 14 mg/dL (ref 6–23)
CHLORIDE: 104 meq/L (ref 96–112)
CO2: 26 meq/L (ref 19–32)
CREATININE: 0.83 mg/dL (ref 0.40–1.20)
Calcium: 9.9 mg/dL (ref 8.4–10.5)
GFR: 73.82 mL/min (ref >60.00)
Glucose, Bld: 92 mg/dL (ref 70–99)
Potassium: 4.7 mEq/L (ref 3.5–5.1)
SODIUM: 140 meq/L (ref 135–145)
Total Bilirubin: 0.4 mg/dL (ref 0.2–1.2)
Total Protein: 6.9 g/dL (ref 6.0–8.3)

## 2017-05-31 LAB — LIPID PANEL
CHOLESTEROL: 229 mg/dL — AB (ref 0–200)
HDL: 71.7 mg/dL (ref >39.00)
LDL Cholesterol: 140 mg/dL — ABNORMAL HIGH (ref 0–99)
NONHDL: 157.71
TRIGLYCERIDES: 87 mg/dL (ref 0.0–149.0)
Total CHOL/HDL Ratio: 3
VLDL: 17.4 mg/dL (ref 0.0–40.0)

## 2017-05-31 NOTE — Assessment & Plan Note (Signed)
Annual comprehensive preventive exam was done as well as an evaluation and management of chronic conditions .  During the course of the visit the patient was educated and counseled about appropriate screening and preventive services including :  diabetes screening, lipid analysis with projected  10 year  risk for CAD , nutrition counseling, breast, cervical and colorectal cancer screening, and recommended immunizations.  Printed recommendations for health maintenance screenings was given 

## 2017-05-31 NOTE — Assessment & Plan Note (Signed)
Right ear canal is occluded with wax,  Irrigation needed

## 2017-05-31 NOTE — Assessment & Plan Note (Signed)
I have addressed  BMI and recommended wt loss of 10% of body weight over the next 6 months using a low fat, low starch, high protein  fruit/vegetable based Mediterranean diet and 30 minutes of aerobic exercise a minimum of 5 days per week.   

## 2017-06-07 ENCOUNTER — Telehealth: Payer: Self-pay | Admitting: Internal Medicine

## 2017-06-07 NOTE — Telephone Encounter (Signed)
Pt called to schedule an ear irrigation. It is notated in Dr. Lupita Dawn not but no order that I can see. Please advise, thank you!

## 2017-06-09 DIAGNOSIS — H6121 Impacted cerumen, right ear: Secondary | ICD-10-CM | POA: Diagnosis not present

## 2017-06-14 ENCOUNTER — Ambulatory Visit: Payer: BLUE CROSS/BLUE SHIELD

## 2017-08-18 DIAGNOSIS — R0789 Other chest pain: Secondary | ICD-10-CM | POA: Diagnosis not present

## 2017-08-18 DIAGNOSIS — J209 Acute bronchitis, unspecified: Secondary | ICD-10-CM | POA: Diagnosis not present

## 2017-09-06 DIAGNOSIS — R5381 Other malaise: Secondary | ICD-10-CM | POA: Diagnosis not present

## 2017-09-06 DIAGNOSIS — E559 Vitamin D deficiency, unspecified: Secondary | ICD-10-CM | POA: Diagnosis not present

## 2017-09-06 DIAGNOSIS — R7303 Prediabetes: Secondary | ICD-10-CM | POA: Diagnosis not present

## 2017-09-06 DIAGNOSIS — A6920 Lyme disease, unspecified: Secondary | ICD-10-CM | POA: Diagnosis not present

## 2017-09-06 DIAGNOSIS — F419 Anxiety disorder, unspecified: Secondary | ICD-10-CM | POA: Diagnosis not present

## 2017-09-20 DIAGNOSIS — R5381 Other malaise: Secondary | ICD-10-CM | POA: Diagnosis not present

## 2017-09-20 DIAGNOSIS — R7303 Prediabetes: Secondary | ICD-10-CM | POA: Diagnosis not present

## 2017-09-20 DIAGNOSIS — R5383 Other fatigue: Secondary | ICD-10-CM | POA: Diagnosis not present

## 2017-09-20 DIAGNOSIS — B2790 Infectious mononucleosis, unspecified without complication: Secondary | ICD-10-CM | POA: Diagnosis not present

## 2017-11-08 DIAGNOSIS — E119 Type 2 diabetes mellitus without complications: Secondary | ICD-10-CM | POA: Diagnosis not present

## 2017-11-08 LAB — HM DIABETES EYE EXAM

## 2017-11-23 ENCOUNTER — Encounter: Payer: Self-pay | Admitting: Internal Medicine

## 2018-04-12 DIAGNOSIS — H02831 Dermatochalasis of right upper eyelid: Secondary | ICD-10-CM | POA: Diagnosis not present

## 2018-05-09 DIAGNOSIS — H40003 Preglaucoma, unspecified, bilateral: Secondary | ICD-10-CM | POA: Diagnosis not present

## 2018-05-29 DIAGNOSIS — E119 Type 2 diabetes mellitus without complications: Secondary | ICD-10-CM | POA: Diagnosis not present

## 2018-05-29 DIAGNOSIS — B2790 Infectious mononucleosis, unspecified without complication: Secondary | ICD-10-CM | POA: Diagnosis not present

## 2018-05-29 DIAGNOSIS — A493 Mycoplasma infection, unspecified site: Secondary | ICD-10-CM | POA: Diagnosis not present

## 2018-05-29 DIAGNOSIS — R5381 Other malaise: Secondary | ICD-10-CM | POA: Diagnosis not present

## 2018-05-29 DIAGNOSIS — E559 Vitamin D deficiency, unspecified: Secondary | ICD-10-CM | POA: Diagnosis not present

## 2018-06-07 DIAGNOSIS — B2790 Infectious mononucleosis, unspecified without complication: Secondary | ICD-10-CM | POA: Diagnosis not present

## 2018-06-07 DIAGNOSIS — R5383 Other fatigue: Secondary | ICD-10-CM | POA: Diagnosis not present

## 2018-06-07 DIAGNOSIS — R1012 Left upper quadrant pain: Secondary | ICD-10-CM | POA: Diagnosis not present

## 2018-06-07 DIAGNOSIS — R5381 Other malaise: Secondary | ICD-10-CM | POA: Diagnosis not present

## 2018-06-09 ENCOUNTER — Other Ambulatory Visit: Payer: Self-pay | Admitting: Family Medicine

## 2018-06-09 DIAGNOSIS — R1012 Left upper quadrant pain: Secondary | ICD-10-CM

## 2018-06-19 ENCOUNTER — Ambulatory Visit
Admission: RE | Admit: 2018-06-19 | Discharge: 2018-06-19 | Disposition: A | Discharge status: Home / Self Care | Payer: BLUE CROSS/BLUE SHIELD | Source: Ambulatory Visit | Attending: Family Medicine | Admitting: Family Medicine

## 2018-06-19 DIAGNOSIS — R1012 Left upper quadrant pain: Secondary | ICD-10-CM

## 2018-06-19 DIAGNOSIS — K7689 Other specified diseases of liver: Secondary | ICD-10-CM | POA: Diagnosis not present

## 2018-06-28 DIAGNOSIS — Z6828 Body mass index (BMI) 28.0-28.9, adult: Secondary | ICD-10-CM | POA: Diagnosis not present

## 2018-06-28 DIAGNOSIS — L309 Dermatitis, unspecified: Secondary | ICD-10-CM | POA: Diagnosis not present

## 2018-08-02 ENCOUNTER — Encounter: Payer: BLUE CROSS/BLUE SHIELD | Admitting: Internal Medicine

## 2018-09-21 DIAGNOSIS — R7303 Prediabetes: Secondary | ICD-10-CM | POA: Diagnosis not present

## 2018-09-21 DIAGNOSIS — R1012 Left upper quadrant pain: Secondary | ICD-10-CM | POA: Diagnosis not present

## 2018-09-21 DIAGNOSIS — R5381 Other malaise: Secondary | ICD-10-CM | POA: Diagnosis not present

## 2018-09-21 DIAGNOSIS — B2790 Infectious mononucleosis, unspecified without complication: Secondary | ICD-10-CM | POA: Diagnosis not present

## 2018-10-02 DIAGNOSIS — B2790 Infectious mononucleosis, unspecified without complication: Secondary | ICD-10-CM | POA: Diagnosis not present

## 2018-10-02 DIAGNOSIS — R1012 Left upper quadrant pain: Secondary | ICD-10-CM | POA: Diagnosis not present

## 2018-10-02 DIAGNOSIS — R5381 Other malaise: Secondary | ICD-10-CM | POA: Diagnosis not present

## 2018-10-02 DIAGNOSIS — R5383 Other fatigue: Secondary | ICD-10-CM | POA: Diagnosis not present

## 2018-10-18 ENCOUNTER — Ambulatory Visit (INDEPENDENT_AMBULATORY_CARE_PROVIDER_SITE_OTHER): Payer: BLUE CROSS/BLUE SHIELD | Admitting: Internal Medicine

## 2018-10-18 ENCOUNTER — Encounter: Payer: Self-pay | Admitting: Internal Medicine

## 2018-10-18 VITALS — BP 126/72 | HR 83 | Temp 98.0°F | Resp 14 | Ht 64.0 in | Wt 162.0 lb

## 2018-10-18 DIAGNOSIS — E785 Hyperlipidemia, unspecified: Secondary | ICD-10-CM

## 2018-10-18 DIAGNOSIS — N644 Mastodynia: Secondary | ICD-10-CM

## 2018-10-18 DIAGNOSIS — E1169 Type 2 diabetes mellitus with other specified complication: Secondary | ICD-10-CM | POA: Diagnosis not present

## 2018-10-18 DIAGNOSIS — R0609 Other forms of dyspnea: Secondary | ICD-10-CM

## 2018-10-18 DIAGNOSIS — R079 Chest pain, unspecified: Secondary | ICD-10-CM | POA: Diagnosis not present

## 2018-10-18 DIAGNOSIS — Z0001 Encounter for general adult medical examination with abnormal findings: Secondary | ICD-10-CM

## 2018-10-18 DIAGNOSIS — Z1211 Encounter for screening for malignant neoplasm of colon: Secondary | ICD-10-CM

## 2018-10-18 DIAGNOSIS — R0789 Other chest pain: Secondary | ICD-10-CM | POA: Diagnosis not present

## 2018-10-18 DIAGNOSIS — R5383 Other fatigue: Secondary | ICD-10-CM

## 2018-10-18 DIAGNOSIS — N6320 Unspecified lump in the left breast, unspecified quadrant: Secondary | ICD-10-CM

## 2018-10-18 DIAGNOSIS — R0782 Intercostal pain: Secondary | ICD-10-CM

## 2018-10-18 DIAGNOSIS — Z8249 Family history of ischemic heart disease and other diseases of the circulatory system: Secondary | ICD-10-CM

## 2018-10-18 LAB — CBC WITH DIFFERENTIAL/PLATELET
BASOS ABS: 0 10*3/uL (ref 0.0–0.1)
Basophils Relative: 1 % (ref 0.0–3.0)
EOS PCT: 1.3 % (ref 0.0–5.0)
Eosinophils Absolute: 0.1 10*3/uL (ref 0.0–0.7)
HCT: 38.1 % (ref 36.0–46.0)
Hemoglobin: 12.9 g/dL (ref 12.0–15.0)
LYMPHS ABS: 1 10*3/uL (ref 0.7–4.0)
Lymphocytes Relative: 25.2 % (ref 12.0–46.0)
MCHC: 33.9 g/dL (ref 30.0–36.0)
MCV: 94.2 fl (ref 78.0–100.0)
MONOS PCT: 10 % (ref 3.0–12.0)
Monocytes Absolute: 0.4 10*3/uL (ref 0.1–1.0)
NEUTROS ABS: 2.6 10*3/uL (ref 1.4–7.7)
NEUTROS PCT: 62.5 % (ref 43.0–77.0)
PLATELETS: 199 10*3/uL (ref 150.0–400.0)
RBC: 4.04 Mil/uL (ref 3.87–5.11)
RDW: 13 % (ref 11.5–15.5)
WBC: 4.2 10*3/uL (ref 4.0–10.5)

## 2018-10-18 LAB — COMPREHENSIVE METABOLIC PANEL
ALT: 17 U/L (ref 0–35)
AST: 22 U/L (ref 0–37)
Albumin: 4.6 g/dL (ref 3.5–5.2)
Alkaline Phosphatase: 94 U/L (ref 39–117)
BILIRUBIN TOTAL: 0.5 mg/dL (ref 0.2–1.2)
BUN: 8 mg/dL (ref 6–23)
CO2: 28 meq/L (ref 19–32)
Calcium: 9.7 mg/dL (ref 8.4–10.5)
Chloride: 105 mEq/L (ref 96–112)
Creatinine, Ser: 0.7 mg/dL (ref 0.40–1.20)
GFR: 84.17 mL/min (ref >60.00)
GLUCOSE: 93 mg/dL (ref 70–99)
Potassium: 4.1 mEq/L (ref 3.5–5.1)
SODIUM: 140 meq/L (ref 135–145)
TOTAL PROTEIN: 6.9 g/dL (ref 6.0–8.3)

## 2018-10-18 LAB — LIPID PANEL
CHOL/HDL RATIO: 3
Cholesterol: 192 mg/dL (ref 0–200)
HDL: 66.8 mg/dL (ref >39.00)
LDL Cholesterol: 105 mg/dL — ABNORMAL HIGH (ref 0–99)
NONHDL: 125.12
Triglycerides: 102 mg/dL (ref 0.0–149.0)
VLDL: 20.4 mg/dL (ref 0.0–40.0)

## 2018-10-18 LAB — TSH: TSH: 0.99 u[IU]/mL (ref 0.35–4.50)

## 2018-10-18 NOTE — Progress Notes (Signed)
Patient ID: Melanie Ward, female    DOB: Nov 19, 1955  Age: 63 y.o. MRN: [id number]  The patient is here for annual PREVENTIVE  examination and management of other chronic and acute problems.  OVERDUE FOR COLONOSCOPY, MAMMOGRAM AND PAP SMEAR :  PAP normal at age 72 Colonoscopy normal 2009 by Medoff at age 33  No polyps .  Referral  Needed  .  Glaucoma suspected  Sees ophthalmologyThree times year [place] Sees [place]  dermatology annually      The risk factors are reflected in the social history.  The roster of all physicians providing medical care to patient - is listed in the Snapshot section of the chart.  Activities of daily living:  The patient is 100% independent in all ADLs: dressing, toileting, feeding as well as independent mobility  Home safety : The patient has smoke detectors in the home. They wear seatbelts.  There are no firearms at home. There is no violence in the home.   There is no risks for hepatitis, STDs or HIV. There is no   history of blood transfusion. They have no travel history to infectious disease endemic areas of the world.  The patient has seen their dentist in the last six month.  She denies  hearing difficulty with regard to whispered voices and some television programs.  she does  not  have excessive sun exposure. Discussed the need for sun protection: hats, long sleeves and use of sunscreen if there is significant sun exposure.   Diet: the importance of a healthy diet is discussed. On a plant based diet for the past 2.5 weeks    The benefits of regular aerobic exercise were discussed. She walks intermittently,  But has stopped exercising due to persistent left lateral chest wall pain and breast pain. The pain has chronic .  Depression screen: there are no signs or vegative symptoms of depression- irritability, change in appetite, anhedonia, sadness/tearfullness.  Cognitive assessment: the patient manages all their financial and personal affairs and is  actively engaged. They could relate day,date,year and events; recalled 2/3 objects at 3 minutes; performed clock-face test normally.  The following portions of the patient's history were reviewed and updated as appropriate: allergies, current medications, past family history, past medical history,  past surgical history, past social history  and problem list.  Visual acuity was not assessed per patient preference since she has regular follow up with her ophthalmologist. Hearing and body mass index were assessed and reviewed.   During the course of the visit the patient was educated and counseled about appropriate screening and preventive services including : fall prevention , diabetes screening, nutrition counseling, colorectal cancer screening, and recommended immunizations.    CC: The primary encounter diagnosis was Encounter for general adult medical examination with abnormal findings. Diagnoses of Other fatigue, Chest pain at rest, Hyperlipidemia associated with type 2 diabetes mellitus ([place]), Breast pain, left, Exertional dyspnea, Family history of early CAD, Colon cancer screening, Mass of left breast, Intercostal pain, Left-sided chest wall pain, and Hyperlipidemia LDL goal <100 were also pertinent to this visit.  Sore throat started yesterday  Multiple sick contacts.  No fevers or body aches. Mild Headache , no cough  Minimal sinus congestion  Seeing an integrative specialist .    History [name] has a past medical history of Cardiac arrhythmia, Endometriosis (1992), GERD (gastroesophageal reflux disease), Hearing loss due to cerumen impaction, right (05/30/2017), and History of chicken pox.   She has a past surgical history that includes  Tonsillectomy and adenoidectomy (1964); Laparoscopic endometriosis fulguration (1992); and Appendectomy (1973).   Her family history includes Cancer in her mother; Heart attack (age of onset: 65) in her maternal grandmother; Heart disease (age of onset: 76)  in her father; Hypertension in her mother; Stroke in her maternal grandfather.She reports that she has never smoked. She has never used smokeless tobacco. She reports that she does not drink alcohol or use drugs.  Outpatient Medications Prior to Visit  Medication Sig Dispense Refill  . Cholecalciferol (VITAMIN D) 2000 UNITS CAPS Take by mouth.    . fish oil-omega-3 fatty acids 1000 MG capsule Take 2 g by mouth daily.    . Multiple Vitamin (MULTIVITAMIN) tablet Take 1 tablet by mouth daily.    Marland Kitchen triamcinolone cream (KENALOG) 0.1 % Apply 1 application topically 2 (two) times daily. 80 g 2   No facility-administered medications prior to visit.     Review of Systems  Patient denies headache, fevers, malaise, unintentional weight loss, skin rash, eye pain, sinus congestion and sinus pain, sore throat, dysphagia,  hemoptysis , cough, dyspnea, wheezing, chest pain, palpitations, orthopnea, edema, abdominal pain, nausea, melena, diarrhea, constipation, flank pain, dysuria, hematuria, urinary  Frequency, nocturia, numbness, tingling, seizures,  Focal weakness, Loss of consciousness,  Tremor, insomnia, depression, anxiety, and suicidal ideation.     Objective:  BP 126/72 (BP Location: Left Arm, Patient Position: Sitting, Cuff Size: Normal)   Pulse 83   Temp 98 F (36.7 C) (Oral)   Resp 14   Ht 5\' 4"  (1.626 m)   Wt 162 lb (73.5 kg)   SpO2 97%   BMI 27.81 kg/m   Physical Exam   General appearance: alert, cooperative and appears stated age Head: Normocephalic, without obvious abnormality, atraumatic Eyes: conjunctivae/corneas clear. PERRL, EOM's intact. Fundi benign. Ears: normal TM's and external ear canals both ears Nose: Nares normal. Septum midline. Mucosa normal. No drainage or sinus tenderness. Throat: lips, mucosa, and tongue normal; teeth and gums normal Neck: no adenopathy, no carotid bruit, no JVD, supple, symmetrical, trachea midline and thyroid not enlarged, symmetric, no  tenderness/mass/nodules Lungs: clear to auscultation bilaterally Breasts: normal appearance, no masses. Some tenderness of left lateral breast in mid axially line , no erythema  or mass  Heart: regular rate and rhythm, S1, S2 normal, no murmur, click, rub or gallop Abdomen: soft, non-tender; bowel sounds normal; no masses,  no organomegaly Extremities: extremities normal, atraumatic, no cyanosis or edema Pulses: 2+ and symmetric Skin: Skin color, texture, turgor normal. No rashes or lesions Neurologic: Alert and oriented X 3, normal strength and tone. Normal symmetric reflexes. Normal coordination and gait.     Assessment & Plan:   Problem List Items Addressed This Visit    Breast pain, left    She continues to be bothered by left lateral breast pain.  There is no mass on exam..  Diagnostic mammogram and [place] ordered.       Relevant Orders   MM Digital Diagnostic Bilat   US BREAST COMPLETE UNI LEFT INC AXILLA   Encounter for general adult medical examination with abnormal findings - Primary    Annual comprehensive preventive exam was done as well as an evaluation and management of chronic conditions .  During the course of the visit the patient was educated and counseled about appropriate screening and preventive services including :  diabetes screening, lipid analysis with projected  10 year  risk for CAD , nutrition counseling, breast, cervical and colorectal cancer screening, and recommended  immunizations.  Printed recommendations for health maintenance screenings was give      Chest pain    She has been abstaining from exercise due to atypical chest pain . She also notes dyspnea at rest.  EKG done today and interpreted by me notes new Q waves in lead III (and a slight one in AVF) as well as new  TWI in  V2 .  Referral to Adrian Prows at [facility] per patient request.       Left-sided chest wall pain    Chronic , recurrent.  Begin workup for cardiac disease and diagnostic  mammogram ordered as well.       Hyperlipidemia LDL goal <100    10 yt risk of CAD using the FRC is 6%.  No therapy indicated at this time,   Lab Results  Component Value Date   CHOL 192 10/18/2018   HDL 66.80 10/18/2018   LDLCALC 105 (H) 10/18/2018   LDLDIRECT 135.6 06/20/2013   TRIG 102.0 10/18/2018   CHOLHDL 3 10/18/2018          Other Visit Diagnoses    Other fatigue       Relevant Orders   Comprehensive metabolic panel (Completed)   CBC with Differential/Platelet (Completed)   TSH (Completed)   Chest pain at rest       Relevant Orders   EKG 12-Lead (Completed)   Ambulatory referral to Cardiology   Hyperlipidemia associated with type 2 diabetes mellitus ([place])       Relevant Orders   Lipid panel (Completed)   Exertional dyspnea       Relevant Orders   Ambulatory referral to Cardiology   Family history of early CAD       Relevant Orders   Ambulatory referral to Cardiology   Colon cancer screening       Relevant Orders   Ambulatory referral to Gastroenterology      I am having Ashaki G. Scullion maintain her multivitamin, fish oil-omega-3 fatty acids, Vitamin D, and triamcinolone cream.  No orders of the defined types were placed in this encounter.   There are no discontinued medications.  Follow-up: No follow-ups on file.   Crecencio Mc, MD

## 2018-10-18 NOTE — Patient Instructions (Signed)
Your mammogram and referrals to Dr Einar Gip and [facility] have been ordered   Health Maintenance for Postmenopausal Women Menopause is a normal process in which your reproductive ability comes to an end. This process happens gradually over a span of months to years, usually between the ages of 35 and 80. Menopause is complete when you have missed 12 consecutive menstrual periods. It is important to talk with your health care provider about some of the most common conditions that affect postmenopausal women, such as heart disease, cancer, and bone loss (osteoporosis). Adopting a healthy lifestyle and getting preventive care can help to promote your health and wellness. Those actions can also lower your chances of developing some of these common conditions. What should I know about menopause? During menopause, you may experience a number of symptoms, such as:  Moderate-to-severe hot flashes.  Night sweats.  Decrease in sex drive.  Mood swings.  Headaches.  Tiredness.  Irritability.  Memory problems.  Insomnia. Choosing to treat or not to treat menopausal changes is an individual decision that you make with your health care provider. What should I know about hormone replacement therapy and supplements? Hormone therapy products are effective for treating symptoms that are associated with menopause, such as hot flashes and night sweats. Hormone replacement carries certain risks, especially as you become older. If you are thinking about using estrogen or estrogen with progestin treatments, discuss the benefits and risks with your health care provider. What should I know about heart disease and stroke? Heart disease, heart attack, and stroke become more likely as you age. This may be due, in part, to the hormonal changes that your body experiences during menopause. These can affect how your body processes dietary fats, triglycerides, and cholesterol. Heart attack and stroke are both medical  emergencies. There are many things that you can do to help prevent heart disease and stroke:  Have your blood pressure checked at least every 1-2 years. High blood pressure causes heart disease and increases the risk of stroke.  If you are 11-58 years old, ask your health care provider if you should take aspirin to prevent a heart attack or a stroke.  Do not use any tobacco products, including cigarettes, chewing tobacco, or electronic cigarettes. If you need help quitting, ask your health care provider.  It is important to eat a healthy diet and maintain a healthy weight. ? Be sure to include plenty of vegetables, fruits, low-fat dairy products, and lean protein. ? Avoid eating foods that are high in solid fats, added sugars, or salt (sodium).  Get regular exercise. This is one of the most important things that you can do for your health. ? Try to exercise for at least 150 minutes each week. The type of exercise that you do should increase your heart rate and make you sweat. This is known as moderate-intensity exercise. ? Try to do strengthening exercises at least twice each week. Do these in addition to the moderate-intensity exercise.  Know your numbers.Ask your health care provider to check your cholesterol and your blood glucose. Continue to have your blood tested as directed by your health care provider.  What should I know about cancer screening? There are several types of cancer. Take the following steps to reduce your risk and to catch any cancer development as early as possible. Breast Cancer  Practice breast self-awareness. ? This means understanding how your breasts normally appear and feel. ? It also means doing regular breast self-exams. Let your health care provider  know about any changes, no matter how small.  If you are 42 or older, have a clinician do a breast exam (clinical breast exam or CBE) every year. Depending on your age, family history, and medical history, it  may be recommended that you also have a yearly breast X-ray (mammogram).  If you have a family history of breast cancer, talk with your health care provider about genetic screening.  If you are at high risk for breast cancer, talk with your health care provider about having an MRI and a mammogram every year.  Breast cancer (BRCA) gene test is recommended for women who have family members with BRCA-related cancers. Results of the assessment will determine the need for genetic counseling and BRCA1 and for BRCA2 testing. BRCA-related cancers include these types: ? Breast. This occurs in males or females. ? Ovarian. ? Tubal. This may also be called fallopian tube cancer. ? Cancer of the abdominal or pelvic lining (peritoneal cancer). ? Prostate. ? Pancreatic. Cervical, Uterine, and Ovarian Cancer Your health care provider may recommend that you be screened regularly for cancer of the pelvic organs. These include your ovaries, uterus, and vagina. This screening involves a pelvic exam, which includes checking for microscopic changes to the surface of your cervix (Pap test).  For women ages 21-65, health care providers may recommend a pelvic exam and a Pap test every three years. For women ages 71-65, they may recommend the Pap test and pelvic exam, combined with testing for human papilloma virus (HPV), every five years. Some types of HPV increase your risk of cervical cancer. Testing for HPV may also be done on women of any age who have unclear Pap test results.  Other health care providers may not recommend any screening for nonpregnant women who are considered low risk for pelvic cancer and have no symptoms. Ask your health care provider if a screening pelvic exam is right for you.  If you have had past treatment for cervical cancer or a condition that could lead to cancer, you need Pap tests and screening for cancer for at least 20 years after your treatment. If Pap tests have been discontinued for  you, your risk factors (such as having a new sexual partner) need to be reassessed to determine if you should start having screenings again. Some women have medical problems that increase the chance of getting cervical cancer. In these cases, your health care provider may recommend that you have screening and Pap tests more often.  If you have a family history of uterine cancer or ovarian cancer, talk with your health care provider about genetic screening.  If you have vaginal bleeding after reaching menopause, tell your health care provider.  There are currently no reliable tests available to screen for ovarian cancer. Lung Cancer Lung cancer screening is recommended for adults 79-86 years old who are at high risk for lung cancer because of a history of smoking. A yearly low-dose CT scan of the lungs is recommended if you:  Currently smoke.  Have a history of at least 30 pack-years of smoking and you currently smoke or have quit within the past 15 years. A pack-year is smoking an average of one pack of cigarettes per day for one year. Yearly screening should:  Continue until it has been 15 years since you quit.  Stop if you develop a health problem that would prevent you from having lung cancer treatment. Colorectal Cancer  This type of cancer can be detected and can often be  prevented.  Routine colorectal cancer screening usually begins at age 30 and continues through age 61.  If you have risk factors for colon cancer, your health care provider may recommend that you be screened at an earlier age.  If you have a family history of colorectal cancer, talk with your health care provider about genetic screening.  Your health care provider may also recommend using home test kits to check for hidden blood in your stool.  A small camera at the end of a tube can be used to examine your colon directly (sigmoidoscopy or colonoscopy). This is done to check for the earliest forms of colorectal  cancer.  Direct examination of the colon should be repeated every 5-10 years until age 68. However, if early forms of precancerous polyps or small growths are found or if you have a family history or genetic risk for colorectal cancer, you may need to be screened more often. Skin Cancer  Check your skin from head to toe regularly.  Monitor any moles. Be sure to tell your health care provider: ? About any new moles or changes in moles, especially if there is a change in a mole's shape or color. ? If you have a mole that is larger than the size of a pencil eraser.  If any of your family members has a history of skin cancer, especially at a young age, talk with your health care provider about genetic screening.  Always use sunscreen. Apply sunscreen liberally and repeatedly throughout the day.  Whenever you are outside, protect yourself by wearing long sleeves, pants, a wide-brimmed hat, and sunglasses. What should I know about osteoporosis? Osteoporosis is a condition in which bone destruction happens more quickly than new bone creation. After menopause, you may be at an increased risk for osteoporosis. To help prevent osteoporosis or the bone fractures that can happen because of osteoporosis, the following is recommended:  If you are 51-7 years old, get at least 1,000 mg of calcium and at least 600 mg of vitamin D per day.  If you are older than age 45 but younger than age 73, get at least 1,200 mg of calcium and at least 600 mg of vitamin D per day.  If you are older than age 47, get at least 1,200 mg of calcium and at least 800 mg of vitamin D per day. Smoking and excessive alcohol intake increase the risk of osteoporosis. Eat foods that are rich in calcium and vitamin D, and do weight-bearing exercises several times each week as directed by your health care provider. What should I know about how menopause affects my mental health? Depression may occur at any age, but it is more common as  you become older. Common symptoms of depression include:  Low or sad mood.  Changes in sleep patterns.  Changes in appetite or eating patterns.  Feeling an overall lack of motivation or enjoyment of activities that you previously enjoyed.  Frequent crying spells. Talk with your health care provider if you think that you are experiencing depression. What should I know about immunizations? It is important that you get and maintain your immunizations. These include:  Tetanus, diphtheria, and pertussis (Tdap) booster vaccine.  Influenza every year before the flu season begins.  Pneumonia vaccine.  Shingles vaccine. Your health care provider may also recommend other immunizations. This information is not intended to replace advice given to you by your health care provider. Make sure you discuss any questions you have with your health care  provider. Document Released: 09/23/2005 Document Revised: 02/19/2016 Document Reviewed: 05/05/2015 Elsevier Interactive Patient Education  2019 [employer].

## 2018-10-20 ENCOUNTER — Encounter: Payer: Self-pay | Admitting: Internal Medicine

## 2018-10-20 DIAGNOSIS — E785 Hyperlipidemia, unspecified: Secondary | ICD-10-CM | POA: Insufficient documentation

## 2018-10-20 NOTE — Assessment & Plan Note (Signed)
Chronic , recurrent.  Begin workup for cardiac disease and diagnostic mammogram ordered as well.

## 2018-10-20 NOTE — Assessment & Plan Note (Signed)
She continues to be bothered by left lateral breast pain.  There is no mass on exam..  Diagnostic mammogram and [place] ordered.

## 2018-10-20 NOTE — Assessment & Plan Note (Signed)
Annual comprehensive preventive exam was done as well as an evaluation and management of chronic conditions .  During the course of the visit the patient was educated and counseled about appropriate screening and preventive services including :  diabetes screening, lipid analysis with projected  10 year  risk for CAD , nutrition counseling, breast, cervical and colorectal cancer screening, and recommended immunizations.  Printed recommendations for health maintenance screenings was give 

## 2018-10-20 NOTE — Assessment & Plan Note (Signed)
10 yt risk of CAD using the FRC is 6%.  No therapy indicated at this time,   Lab Results  Component Value Date   CHOL 192 10/18/2018   HDL 66.80 10/18/2018   LDLCALC 105 (H) 10/18/2018   LDLDIRECT 135.6 06/20/2013   TRIG 102.0 10/18/2018   CHOLHDL 3 10/18/2018

## 2018-10-20 NOTE — Assessment & Plan Note (Addendum)
She has been abstaining from exercise due to atypical chest pain . She also notes dyspnea at rest.  EKG done today and interpreted by me notes new Q waves in lead III (and a slight one in AVF) as well as new  TWI in  V2 .  Referral to Adrian Prows at [facility] per patient request.

## 2018-12-18 DIAGNOSIS — H40003 Preglaucoma, unspecified, bilateral: Secondary | ICD-10-CM | POA: Diagnosis not present

## 2018-12-18 LAB — HM DIABETES EYE EXAM

## 2018-12-25 ENCOUNTER — Encounter: Payer: Self-pay | Admitting: Internal Medicine

## 2018-12-25 DIAGNOSIS — Z803 Family history of malignant neoplasm of breast: Secondary | ICD-10-CM | POA: Diagnosis not present

## 2018-12-25 DIAGNOSIS — N644 Mastodynia: Secondary | ICD-10-CM | POA: Diagnosis not present

## 2019-01-17 DIAGNOSIS — Z1211 Encounter for screening for malignant neoplasm of colon: Secondary | ICD-10-CM | POA: Diagnosis not present

## 2019-01-17 DIAGNOSIS — K648 Other hemorrhoids: Secondary | ICD-10-CM | POA: Diagnosis not present

## 2019-01-17 LAB — HM COLONOSCOPY

## 2019-01-18 ENCOUNTER — Encounter: Payer: BLUE CROSS/BLUE SHIELD | Admitting: Internal Medicine

## 2019-03-20 DIAGNOSIS — Z20828 Contact with and (suspected) exposure to other viral communicable diseases: Secondary | ICD-10-CM | POA: Diagnosis not present

## 2019-04-05 DIAGNOSIS — A6920 Lyme disease, unspecified: Secondary | ICD-10-CM | POA: Diagnosis not present

## 2019-04-05 DIAGNOSIS — R5383 Other fatigue: Secondary | ICD-10-CM | POA: Diagnosis not present

## 2019-04-05 DIAGNOSIS — B2790 Infectious mononucleosis, unspecified without complication: Secondary | ICD-10-CM | POA: Diagnosis not present

## 2019-04-05 DIAGNOSIS — E559 Vitamin D deficiency, unspecified: Secondary | ICD-10-CM | POA: Diagnosis not present

## 2019-04-05 DIAGNOSIS — M329 Systemic lupus erythematosus, unspecified: Secondary | ICD-10-CM | POA: Diagnosis not present

## 2019-04-05 DIAGNOSIS — M2550 Pain in unspecified joint: Secondary | ICD-10-CM | POA: Diagnosis not present

## 2019-04-18 DIAGNOSIS — B2790 Infectious mononucleosis, unspecified without complication: Secondary | ICD-10-CM | POA: Diagnosis not present

## 2019-04-18 DIAGNOSIS — R5381 Other malaise: Secondary | ICD-10-CM | POA: Diagnosis not present

## 2019-04-18 DIAGNOSIS — R5383 Other fatigue: Secondary | ICD-10-CM | POA: Diagnosis not present

## 2019-04-18 DIAGNOSIS — R7303 Prediabetes: Secondary | ICD-10-CM | POA: Diagnosis not present

## 2019-06-18 DIAGNOSIS — H40003 Preglaucoma, unspecified, bilateral: Secondary | ICD-10-CM | POA: Diagnosis not present

## 2019-06-22 DIAGNOSIS — R519 Headache, unspecified: Secondary | ICD-10-CM | POA: Diagnosis not present

## 2019-06-22 DIAGNOSIS — R05 Cough: Secondary | ICD-10-CM | POA: Diagnosis not present

## 2019-07-02 ENCOUNTER — Emergency Department
Admission: EM | Admit: 2019-07-02 | Discharge: 2019-07-02 | Disposition: A | Discharge status: Home / Self Care | Payer: BC Managed Care – PPO | Attending: Emergency Medicine | Admitting: Emergency Medicine

## 2019-07-02 ENCOUNTER — Other Ambulatory Visit: Payer: Self-pay

## 2019-07-02 ENCOUNTER — Encounter: Payer: Self-pay | Admitting: Emergency Medicine

## 2019-07-02 ENCOUNTER — Emergency Department: Payer: BC Managed Care – PPO

## 2019-07-02 ENCOUNTER — Telehealth: Payer: BLUE CROSS/BLUE SHIELD | Admitting: Emergency Medicine

## 2019-07-02 DIAGNOSIS — J069 Acute upper respiratory infection, unspecified: Secondary | ICD-10-CM | POA: Insufficient documentation

## 2019-07-02 DIAGNOSIS — R0989 Other specified symptoms and signs involving the circulatory and respiratory systems: Secondary | ICD-10-CM | POA: Diagnosis not present

## 2019-07-02 DIAGNOSIS — R000 Tachycardia, unspecified: Secondary | ICD-10-CM | POA: Diagnosis not present

## 2019-07-02 DIAGNOSIS — Z20828 Contact with and (suspected) exposure to other viral communicable diseases: Secondary | ICD-10-CM | POA: Insufficient documentation

## 2019-07-02 DIAGNOSIS — R0602 Shortness of breath: Secondary | ICD-10-CM

## 2019-07-02 DIAGNOSIS — Z79899 Other long term (current) drug therapy: Secondary | ICD-10-CM | POA: Diagnosis not present

## 2019-07-02 DIAGNOSIS — R079 Chest pain, unspecified: Secondary | ICD-10-CM | POA: Diagnosis not present

## 2019-07-02 LAB — CBC
HCT: 37.4 % (ref 36.0–46.0)
Hemoglobin: 12.6 g/dL (ref 12.0–15.0)
MCH: 31.1 pg (ref 26.0–34.0)
MCHC: 33.7 g/dL (ref 30.0–36.0)
MCV: 92.3 fL (ref 80.0–100.0)
Platelets: 206 10*3/uL (ref 150–400)
RBC: 4.05 MIL/uL (ref 3.87–5.11)
RDW: 12 % (ref 11.5–15.5)
WBC: 5.4 10*3/uL (ref 4.0–10.5)
nRBC: 0 % (ref 0.0–0.2)

## 2019-07-02 LAB — BASIC METABOLIC PANEL
Anion gap: 9 (ref 5–15)
BUN: 15 mg/dL (ref 8–23)
CO2: 26 mmol/L (ref 22–32)
Calcium: 9.4 mg/dL (ref 8.9–10.3)
Chloride: 106 mmol/L (ref 98–111)
Creatinine, Ser: 0.56 mg/dL (ref 0.44–1.00)
GFR calc Af Amer: >60 mL/min (ref >60)
GFR calc non Af Amer: >60 mL/min (ref >60)
Glucose, Bld: 137 mg/dL — ABNORMAL HIGH (ref 70–99)
Potassium: 4.3 mmol/L (ref 3.5–5.1)
Sodium: 141 mmol/L (ref 135–145)

## 2019-07-02 LAB — TROPONIN I (HIGH SENSITIVITY): Troponin I (High Sensitivity): <2 ng/L (ref <18)

## 2019-07-02 NOTE — ED Provider Notes (Signed)
[facility] Emergency Department Provider Note  Time seen: 3:38 PM  I have reviewed the triage vital signs and the nursing notes.   HISTORY  Chief Complaint Chest Pain   HPI Melanie Ward is a 63 y.o. female with a past medical history of gastric reflux, presents to the emergency department for 2 to 3 weeks of intermittent chest congestion sore throat runny nose.  According to the patient for the past 2 to 3 weeks she has been experiencing intermittent congestion which she describes as a cough with occasional postnasal drip and sinus and nasal congestion.  States a few time she has had a sore throat.  States her symptoms are waxing and waning, 2 weeks ago had a rapid Covid test that was negative.  She called her doctor back today because she continues to have intermittent symptoms and they recommended she go to the emergency department for evaluation and for repeat Covid test per patient.  Past Medical History:  Diagnosis Date  . Cardiac arrhythmia   . Endometriosis 1992  . GERD (gastroesophageal reflux disease)   . Hearing loss due to cerumen impaction, right 05/30/2017   irrigatio nneeded   . History of chicken pox     Patient Active Problem List   Diagnosis Date Noted  . Hyperlipidemia LDL goal <100 10/20/2018  . Left-sided chest wall pain 05/15/2015  . Chest pain 04/30/2015  . Breast pain, left 12/20/2013  . Encounter for general adult medical examination with abnormal findings 12/20/2013  . Overweight (BMI 25.0-29.9) 10/13/2012  . GERD (gastroesophageal reflux disease) 10/13/2012    Past Surgical History:  Procedure Laterality Date  . APPENDECTOMY  1973  . LAPAROSCOPIC ENDOMETRIOSIS FULGURATION  1992  . TONSILLECTOMY AND ADENOIDECTOMY  1964    Prior to Admission medications   Medication Sig Start Date End Date Taking? Authorizing Provider  Cholecalciferol (VITAMIN D) 2000 UNITS CAPS Take by mouth.    [provider]  fish oil-omega-3  fatty acids 1000 MG capsule Take 2 g by mouth daily.    [provider]  Multiple Vitamin (MULTIVITAMIN) tablet Take 1 tablet by mouth daily.    [provider]  triamcinolone cream (KENALOG) 0.1 % Apply 1 application topically 2 (two) times daily. 05/30/17   Crecencio Mc, MD    Allergies  Allergen Reactions  . Zithromax [Azithromycin] Rash    Family History  Problem Relation Age of Onset  . Cancer Mother        breast, died at 87  . Hypertension Mother   . Heart disease Father 101  . Heart attack Maternal Grandmother 54  . Stroke Maternal Grandfather     Social History Social History   Tobacco Use  . Smoking status: Never Smoker  . Smokeless tobacco: Never Used  Substance Use Topics  . Alcohol use: No  . Drug use: No    Review of Systems Constitutional: Negative for fever. ENT: Intermittent nasal congestion and sore throat Cardiovascular: Negative for chest pain. Respiratory: Negative for shortness of breath.  Intermittent cough/chest congestion. Gastrointestinal: Negative for abdominal pain Musculoskeletal: Negative for musculoskeletal complaints Neurological: Negative for headache All other ROS negative  ____________________________________________   PHYSICAL EXAM:  VITAL SIGNS: ED Triage Vitals [07/02/19 1346]  Enc Vitals Group     BP (!) 159/79     Pulse Rate 99     Resp 16     Temp 98.1 F (36.7 C)     Temp Source Oral  SpO2 99 %     Weight      Height      Head Circumference      Peak Flow      Pain Score 4     Pain Loc      Pain Edu?      Excl. in [place]?     Constitutional: Alert and oriented. Well appearing and in no distress. Eyes: Normal exam ENT      Head: Normocephalic and atraumatic.      Mouth/Throat: Mucous membranes are moist. Cardiovascular: Normal rate, regular rhythm. Respiratory: Normal respiratory effort without tachypnea nor retractions. Breath sounds are clear Gastrointestinal: Soft and nontender. No  distention.  Musculoskeletal: Nontender with normal range of motion in all extremities.  Neurologic:  Normal speech and language. No gross focal neurologic deficits  Skin:  Skin is warm, dry and intact.  Psychiatric: Mood and affect are normal.  ____________________________________________    EKG  EKG viewed and interpreted by myself shows sinus tachycardia at 102 beats per minute.  Narrow QRS, normal axis, normal intervals, no concerning ST changes.  ____________________________________________    RADIOLOGY  Chest x-ray is negative for acute disease  ____________________________________________   INITIAL IMPRESSION / ASSESSMENT AND PLAN / ED COURSE  Pertinent labs & imaging results that were available during my care of the patient were reviewed by me and considered in my medical decision making (see chart for details).   Patient presents emergency department for 2 to 3 weeks of nasal congestion occasional sore throat occasional cough.  Overall the patient appears well with a normal physical exam.  Patient's EKG shows borderline tachycardia but otherwise normal in appearance.  Lab work is reassuring including a negative troponin.  Chest x-ray is negative.  Overall the patient appears very well.  We will provide a Covid test for the patient and have her follow-up with her doctor.  Melanie Ward was evaluated in Emergency Department on 07/02/2019 for the symptoms described in the history of present illness. She was evaluated in the context of the global COVID-19 pandemic, which necessitated consideration that the patient might be at risk for infection with the SARS-CoV-2 virus that causes COVID-19. Institutional protocols and algorithms that pertain to the evaluation of patients at risk for COVID-19 are in a state of rapid change based on information released by regulatory bodies including the CDC and federal and state organizations. These policies and algorithms were followed during the  patient's care in the ED.  ____________________________________________   FINAL CLINICAL IMPRESSION(S) / ED DIAGNOSES  Upper respiratory infection   Harvest Dark, MD 07/02/19 1542

## 2019-07-02 NOTE — Progress Notes (Signed)
  E-Visit for [employer] Virus Screening  Based on what you have shared with me, you need to seek an evaluation for a severe illness that is causing your symptoms which may be coronavirus or some other illness. I recommend that you be seen and evaluated "face to face". If you are considered high risk for Corona virus because of a known exposure, fever, shortness of breath and cough, OR if you have severe symptoms of any kind, seek medical care at an emergency room. Our Emergency Departments are best equipped to handle patients with severe symptoms.  You will be evaluated by the ER provider (or higher level of care provider) who will determine whether you need formal testing.  If you are having a true medical emergency please call 911.   I recommend the following:  . [facility] Emergency Department [place], [place], [place] [address] [phone]  . [facility] Emergency Department [place], [place], [place] [address] [phone]  . [facility] Emergency Department [place], [place], [place] [address] [phone]  . [facility] Emergency Department [address] [place], [place], [place] [address] [phone]  . [facility] Emergency Department [place], [place], [place] [address] [id number]  NOTE: If you entered your credit card information for this eVisit, you will not be charged. You may see a "hold" on your card for the $35 but that hold will drop off and you will not have a charge processed.   Your e-visit answers were reviewed by a board certified advanced clinical practitioner to complete your personal care plan.  Thank you for using e-Visits.  Approximately 5 minutes was used in reviewing the patient's chart, questionnaire, prescribing medications, and documentation.

## 2019-07-02 NOTE — ED Triage Notes (Signed)
PT states she has had chest pressure, congestion x2wks. States recent covid test that was negative. States chest pressure started again yesterday. NAD noted

## 2019-07-03 LAB — SARS CORONAVIRUS 2 (TAT 6-24 HRS): SARS Coronavirus 2: NEGATIVE

## 2019-11-06 DIAGNOSIS — R7303 Prediabetes: Secondary | ICD-10-CM | POA: Diagnosis not present

## 2019-11-06 DIAGNOSIS — R5381 Other malaise: Secondary | ICD-10-CM | POA: Diagnosis not present

## 2019-11-06 DIAGNOSIS — M329 Systemic lupus erythematosus, unspecified: Secondary | ICD-10-CM | POA: Diagnosis not present

## 2019-11-06 DIAGNOSIS — E559 Vitamin D deficiency, unspecified: Secondary | ICD-10-CM | POA: Diagnosis not present

## 2019-11-06 DIAGNOSIS — B2790 Infectious mononucleosis, unspecified without complication: Secondary | ICD-10-CM | POA: Diagnosis not present

## 2019-11-13 DIAGNOSIS — Z20822 Contact with and (suspected) exposure to covid-19: Secondary | ICD-10-CM | POA: Diagnosis not present

## 2019-11-20 DIAGNOSIS — R5383 Other fatigue: Secondary | ICD-10-CM | POA: Diagnosis not present

## 2019-11-20 DIAGNOSIS — R5381 Other malaise: Secondary | ICD-10-CM | POA: Diagnosis not present

## 2019-11-20 DIAGNOSIS — B2790 Infectious mononucleosis, unspecified without complication: Secondary | ICD-10-CM | POA: Diagnosis not present

## 2019-11-20 DIAGNOSIS — R7303 Prediabetes: Secondary | ICD-10-CM | POA: Diagnosis not present

## 2019-11-26 DIAGNOSIS — M722 Plantar fascial fibromatosis: Secondary | ICD-10-CM | POA: Diagnosis not present

## 2019-11-26 DIAGNOSIS — Z6827 Body mass index (BMI) 27.0-27.9, adult: Secondary | ICD-10-CM | POA: Diagnosis not present

## 2020-01-16 ENCOUNTER — Other Ambulatory Visit: Payer: Self-pay

## 2020-01-20 ENCOUNTER — Ambulatory Visit (INDEPENDENT_AMBULATORY_CARE_PROVIDER_SITE_OTHER): Payer: BC Managed Care – PPO | Admitting: Internal Medicine

## 2020-01-20 ENCOUNTER — Other Ambulatory Visit (HOSPITAL_COMMUNITY)
Admission: RE | Admit: 2020-01-20 | Discharge: 2020-01-20 | Disposition: A | Discharge status: Home / Self Care | Payer: BC Managed Care – PPO | Source: Ambulatory Visit | Attending: Internal Medicine | Admitting: Internal Medicine

## 2020-01-20 ENCOUNTER — Other Ambulatory Visit: Payer: Self-pay

## 2020-01-20 ENCOUNTER — Encounter: Payer: Self-pay | Admitting: Internal Medicine

## 2020-01-20 VITALS — BP 110/64 | HR 82 | Temp 97.2°F | Resp 14 | Ht 64.0 in | Wt 162.8 lb

## 2020-01-20 DIAGNOSIS — M722 Plantar fascial fibromatosis: Secondary | ICD-10-CM

## 2020-01-20 DIAGNOSIS — Z0000 Encounter for general adult medical examination without abnormal findings: Secondary | ICD-10-CM

## 2020-01-20 DIAGNOSIS — Z8616 Personal history of COVID-19: Secondary | ICD-10-CM

## 2020-01-20 DIAGNOSIS — Z124 Encounter for screening for malignant neoplasm of cervix: Secondary | ICD-10-CM | POA: Insufficient documentation

## 2020-01-20 DIAGNOSIS — E785 Hyperlipidemia, unspecified: Secondary | ICD-10-CM | POA: Diagnosis not present

## 2020-01-20 LAB — LIPID PANEL
Cholesterol: 233 mg/dL — ABNORMAL HIGH (ref 0–200)
HDL: 66.4 mg/dL (ref >39.00)
LDL Cholesterol: 142 mg/dL — ABNORMAL HIGH (ref 0–99)
NonHDL: 166.27
Total CHOL/HDL Ratio: 4
Triglycerides: 121 mg/dL (ref 0.0–149.0)
VLDL: 24.2 mg/dL (ref 0.0–40.0)

## 2020-01-20 LAB — COMPREHENSIVE METABOLIC PANEL
ALT: 18 U/L (ref 0–35)
AST: 23 U/L (ref 0–37)
Albumin: 4.4 g/dL (ref 3.5–5.2)
Alkaline Phosphatase: 98 U/L (ref 39–117)
BUN: 14 mg/dL (ref 6–23)
CO2: 29 mEq/L (ref 19–32)
Calcium: 9.6 mg/dL (ref 8.4–10.5)
Chloride: 104 mEq/L (ref 96–112)
Creatinine, Ser: 0.67 mg/dL (ref 0.40–1.20)
GFR: 88.18 mL/min (ref >60.00)
Glucose, Bld: 104 mg/dL — ABNORMAL HIGH (ref 70–99)
Potassium: 4.3 mEq/L (ref 3.5–5.1)
Sodium: 139 mEq/L (ref 135–145)
Total Bilirubin: 0.5 mg/dL (ref 0.2–1.2)
Total Protein: 6.8 g/dL (ref 6.0–8.3)

## 2020-01-20 LAB — SARS-COV-2 IGG: SARS-COV-2 IgG: 0.04

## 2020-01-20 LAB — TSH: TSH: 1.57 u[IU]/mL (ref 0.35–4.50)

## 2020-01-20 MED ORDER — TRIAMCINOLONE ACETONIDE 0.1 % EX CREA: 1.0000 "application " | TOPICAL_CREAM | Freq: Two times a day (BID) | CUTANEOUS | 2 refills | Status: DC

## 2020-01-20 NOTE — Assessment & Plan Note (Signed)
She had a mild confirmed illness 6 weeks ago in the setting of family exposure, and recovered uneventfully with some fatigue that lingered.

## 2020-01-20 NOTE — Assessment & Plan Note (Signed)

## 2020-01-20 NOTE — Assessment & Plan Note (Signed)
symptoms have been present for 3 months and have resulted in decreased participation in exercise.  Podiatry referral offered

## 2020-01-20 NOTE — Patient Instructions (Addendum)
Think Portion reduction until you can join a gym  Try the Healthy Choice low GI (low carb) lunch entrees and  [employer] and [employer] both make a cauliflower pizza bowl . Delicious!!  You may require proof of vaccination and a negative covid test to fly, not just a positive antibody test (which we are checking for today)   Health Maintenance for Postmenopausal Women Menopause is a normal process in which your ability to get pregnant comes to an end. This process happens slowly over many months or years, usually between the ages of 63 and 90. Menopause is complete when you have missed your menstrual periods for 12 months. It is important to talk with your health care provider about some of the most common conditions that affect women after menopause (postmenopausal women). These include heart disease, cancer, and bone loss (osteoporosis). Adopting a healthy lifestyle and getting preventive care can help to promote your health and wellness. The actions you take can also lower your chances of developing some of these common conditions. What should I know about menopause? During menopause, you may get a number of symptoms, such as:  Hot flashes. These can be moderate or severe.  Night sweats.  Decrease in sex drive.  Mood swings.  Headaches.  Tiredness.  Irritability.  Memory problems.  Insomnia. Choosing to treat or not to treat these symptoms is a decision that you make with your health care provider. Do I need hormone replacement therapy?  Hormone replacement therapy is effective in treating symptoms that are caused by menopause, such as hot flashes and night sweats.  Hormone replacement carries certain risks, especially as you become older. If you are thinking about using estrogen or estrogen with progestin, discuss the benefits and risks with your health care provider. What is my risk for heart disease and stroke? The risk of heart disease, heart attack, and stroke  increases as you age. One of the causes may be a change in the body's hormones during menopause. This can affect how your body uses dietary fats, triglycerides, and cholesterol. Heart attack and stroke are medical emergencies. There are many things that you can do to help prevent heart disease and stroke. Watch your blood pressure  High blood pressure causes heart disease and increases the risk of stroke. This is more likely to develop in people who have high blood pressure readings, are of African descent, or are overweight.  Have your blood pressure checked: ? Every 3-5 years if you are 2-40 years of age. ? Every year if you are 78 years old or older. Eat a healthy diet   Eat a diet that includes plenty of vegetables, fruits, low-fat dairy products, and lean protein.  Do not eat a lot of foods that are high in solid fats, added sugars, or sodium. Get regular exercise Get regular exercise. This is one of the most important things you can do for your health. Most adults should:  Try to exercise for at least 150 minutes each week. The exercise should increase your heart rate and make you sweat (moderate-intensity exercise).  Try to do strengthening exercises at least twice each week. Do these in addition to the moderate-intensity exercise.  Spend less time sitting. Even light physical activity can be beneficial. Other tips  Work with your health care provider to achieve or maintain a healthy weight.  Do not use any products that contain nicotine or tobacco, such as cigarettes, e-cigarettes, and chewing tobacco. If you need help quitting, ask your  health care provider.  Know your numbers. Ask your health care provider to check your cholesterol and your blood sugar (glucose). Continue to have your blood tested as directed by your health care provider. Do I need screening for cancer? Depending on your health history and family history, you may need to have cancer screening at different  stages of your life. This may include screening for:  Breast cancer.  Cervical cancer.  Lung cancer.  Colorectal cancer. What is my risk for osteoporosis? After menopause, you may be at increased risk for osteoporosis. Osteoporosis is a condition in which bone destruction happens more quickly than new bone creation. To help prevent osteoporosis or the bone fractures that can happen because of osteoporosis, you may take the following actions:  If you are 80-36 years old, get at least 1,000 mg of calcium and at least 600 mg of vitamin D per day.  If you are older than age 77 but younger than age 35, get at least 1,200 mg of calcium and at least 600 mg of vitamin D per day.  If you are older than age 51, get at least 1,200 mg of calcium and at least 800 mg of vitamin D per day. Smoking and drinking excessive alcohol increase the risk of osteoporosis. Eat foods that are rich in calcium and vitamin D, and do weight-bearing exercises several times each week as directed by your health care provider. How does menopause affect my mental health? Depression may occur at any age, but it is more common as you become older. Common symptoms of depression include:  Low or sad mood.  Changes in sleep patterns.  Changes in appetite or eating patterns.  Feeling an overall lack of motivation or enjoyment of activities that you previously enjoyed.  Frequent crying spells. Talk with your health care provider if you think that you are experiencing depression. General instructions See your health care provider for regular wellness exams and vaccines. This may include:  Scheduling regular health, dental, and eye exams.  Getting and maintaining your vaccines. These include: ? Influenza vaccine. Get this vaccine each year before the flu season begins. ? Pneumonia vaccine. ? Shingles vaccine. ? Tetanus, diphtheria, and pertussis (Tdap) booster vaccine. Your health care provider may also recommend other  immunizations. Tell your health care provider if you have ever been abused or do not feel safe at home. Summary  Menopause is a normal process in which your ability to get pregnant comes to an end.  This condition causes hot flashes, night sweats, decreased interest in sex, mood swings, headaches, or lack of sleep.  Treatment for this condition may include hormone replacement therapy.  Take actions to keep yourself healthy, including exercising regularly, eating a healthy diet, watching your weight, and checking your blood pressure and blood sugar levels.  Get screened for cancer and depression. Make sure that you are up to date with all your vaccines. This information is not intended to replace advice given to you by your health care provider. Make sure you discuss any questions you have with your health care provider. Document Revised: 07/25/2018 Document Reviewed: 07/25/2018 Elsevier Patient Education  2020 [employer].

## 2020-01-20 NOTE — Progress Notes (Signed)
Patient ID: Melanie Ward, female    DOB: 06-15-1956  Age: 64 y.o. MRN: [id number]  The patient is here for annual preventive examination and management of other chronic and acute problems.    Entire family including patients had COVID INFECTION  31 WEEKS AGO son, husband then her.  Mild symptoms   The risk factors are reflected in the social history.  The roster of all physicians providing medical care to patient - is listed in the Snapshot section of the chart.  Activities of daily living:  The patient is 100% independent in all ADLs: dressing, toileting, feeding as well as independent mobility  Home safety : The patient has smoke detectors in the home. They wear seatbelts.  There are no firearms at home. There is no violence in the home.   There is no risks for hepatitis, STDs or HIV. There is no   history of blood transfusion. They have no travel history to infectious disease endemic areas of the world.  The patient has seen their dentist in the last six month. They have seen their eye doctor in the last year.  She denies hearing difficulty with regard to whispered voices and some television programs.  They have deferred audiologic testing in the last year.  They do not  have excessive sun exposure. Discussed the need for sun protection: hats, long sleeves and use of sunscreen if there is significant sun exposure.   Diet: the importance of a healthy diet is discussed. They do have a healthy diet.  The benefits of regular aerobic exercise were discussed. She is currently avoiding walking due to plantar fasciitis    Depression screen: there are no signs or vegative symptoms of depression- irritability, change in appetite, anhedonia, sadness/tearfullness.  Cognitive assessment: the patient manages all their financial and personal affairs and is actively engaged. They could relate day,date,year and events; recalled 2/3 objects at 3 minutes; performed clock-face test normally.  The  following portions of the patient's history were reviewed and updated as appropriate: allergies, current medications, past family history, past medical history,  past surgical history, past social history  and problem list.  Visual acuity was not assessed per patient preference since she has regular follow up with her ophthalmologist. Hearing and body mass index were assessed and reviewed.   During the course of the visit the patient was educated and counseled about appropriate screening and preventive services including : fall prevention , diabetes screening, nutrition counseling, colorectal cancer screening, and recommended immunizations.    CC: The primary encounter diagnosis was Personal history of covid-19. Diagnoses of Hyperlipidemia LDL goal <100, Cervical cancer screening, Plantar fasciitis, bilateral, and Encounter for preventive health examination were also pertinent to this visit.  History Melanie Ward has a past medical history of Cardiac arrhythmia, Endometriosis (1992), GERD (gastroesophageal reflux disease), Hearing loss due to cerumen impaction, right (05/30/2017), and History of chicken pox.   She has a past surgical history that includes Tonsillectomy and adenoidectomy (1964); Laparoscopic endometriosis fulguration (1992); and Appendectomy (1973).   Her family history includes Cancer in her mother; Heart attack (age of onset: 83) in her maternal grandmother; Heart disease (age of onset: 4) in her father; Hypertension in her mother; Stroke in her maternal grandfather.She reports that she has never smoked. She has never used smokeless tobacco. She reports that she does not drink alcohol or use drugs.  Outpatient Medications Prior to Visit  Medication Sig Dispense Refill  . Cholecalciferol (VITAMIN D) 2000 UNITS CAPS Take by mouth.    Marland Kitchen  Multiple Vitamin (MULTIVITAMIN) tablet Take 1 tablet by mouth daily.    . fish oil-omega-3 fatty acids 1000 MG capsule Take 2 g by mouth daily.    Marland Kitchen  triamcinolone cream (KENALOG) 0.1 % Apply 1 application topically 2 (two) times daily. (Patient not taking: Reported on 01/20/2020) 80 g 2   No facility-administered medications prior to visit.    Review of Systems   Patient denies headache, fevers, malaise, unintentional weight loss, skin rash, eye pain, sinus congestion and sinus pain, sore throat, dysphagia,  hemoptysis , cough, dyspnea, wheezing, chest pain, palpitations, orthopnea, edema, abdominal pain, nausea, melena, diarrhea, constipation, flank pain, dysuria, hematuria, urinary  Frequency, nocturia, numbness, tingling, seizures,  Focal weakness, Loss of consciousness,  Tremor, insomnia, depression, anxiety, and suicidal ideation.      Objective:  BP 110/64 (BP Location: Left Arm, Patient Position: Sitting, Cuff Size: Normal)   Pulse 82   Temp (!) 97.2 F (36.2 C) (Temporal)   Resp 14   Ht 5\' 4"  (1.626 m)   Wt 162 lb 12.8 oz (73.8 kg)   SpO2 98%   BMI 27.94 kg/m   Physical Exam  General Appearance:    Alert, cooperative, no distress, appears stated age  Head:    Normocephalic, without obvious abnormality, atraumatic  Eyes:    PERRL, conjunctiva/corneas clear, EOM's intact, fundi    benign, both eyes  Ears:    Normal TM's and external ear canals, both ears  Nose:   Nares normal, septum midline, mucosa normal, no drainage    or sinus tenderness  Throat:   Lips, mucosa, and tongue normal; teeth and gums normal  Neck:   Supple, symmetrical, trachea midline, no adenopathy;    thyroid:  no enlargement/tenderness/nodules; no carotid   bruit or JVD  Back:     Symmetric, no curvature, ROM normal, no CVA tenderness  Lungs:     Clear to auscultation bilaterally, respirations unlabored  Chest Wall:    No tenderness or deformity   Heart:    Regular rate and rhythm, S1 and S2 normal, no murmur, rub   or gallop  Breast Exam:    No tenderness, masses, or nipple abnormality  Abdomen:     Soft, non-tender, bowel sounds active all four  quadrants,    no masses, no organomegaly  Genitalia:    Pelvic: cervix normal in appearance, external genitalia normal, no adnexal masses or tenderness, no cervical motion tenderness, rectovaginal septum normal, uterus normal size, shape, and consistency and vagina normal without discharge  Extremities:   Extremities normal, atraumatic, no cyanosis or edema  Pulses:   2+ and symmetric all extremities  Skin:   Skin color, texture, turgor normal, no rashes or lesions  Lymph nodes:   Cervical, supraclavicular, and axillary nodes normal  Neurologic:   CNII-XII intact, normal strength, sensation and reflexes    throughout     Assessment & Plan:   Problem List Items Addressed This Visit      Unprioritized   Encounter for preventive health examination    age appropriate education and counseling updated, referrals for preventative services and immunizations addressed, dietary and smoking counseling addressed, most recent labs reviewed.  I have personally reviewed and have noted:  1) the patient's medical and social history 2) The pt's use of alcohol, tobacco, and illicit drugs 3) The patient's current medications and supplements 4) Functional ability including ADL's, fall risk, home safety risk, hearing and visual impairment 5) Diet and physical activities 6) Evidence for depression  or mood disorder 7) The patient's height, weight, and BMI have been recorded in the chart  I have made referrals, and provided counseling and education based on review of the above      Hyperlipidemia LDL goal <100   Relevant Orders   Lipid panel   Comprehensive metabolic panel   TSH   Personal history of covid-19 - Primary    She had a mild confirmed illness 6 weeks ago in the setting of family exposure, and recovered uneventfully with some fatigue that lingered.       Relevant Orders   SARS-COV-2 IgG   SARS-CoV-2 Antibody, IgM   Plantar fasciitis, bilateral    symptoms have been present for 3  months and have resulted in decreased participation in exercise.  Podiatry referral offered       Other Visit Diagnoses    Cervical cancer screening       Relevant Orders   Cytology - PAP( [place])      I have discontinued Lesta G. Bula's fish oil-omega-3 fatty acids. I am also having her maintain her multivitamin, Vitamin D, and triamcinolone cream.  Meds ordered this encounter  Medications  . triamcinolone cream (KENALOG) 0.1 %    Sig: Apply 1 application topically 2 (two) times daily.    Dispense:  80 g    Refill:  2    Medications Discontinued During This Encounter  Medication Reason  . triamcinolone cream (KENALOG) 0.1 % Completed Course  . fish oil-omega-3 fatty acids 1000 MG capsule Patient has not taken in last 30 days    Follow-up: No follow-ups on file.   Crecencio Mc, MD

## 2020-01-21 ENCOUNTER — Telehealth: Payer: Self-pay | Admitting: Internal Medicine

## 2020-01-21 LAB — SARS-COV-2 ANTIBODY, IGM: SARS-CoV-2 Antibody, IgM: NEGATIVE

## 2020-01-21 LAB — CYTOLOGY - PAP
Comment: NEGATIVE
Diagnosis: NEGATIVE
High risk HPV: NEGATIVE

## 2020-05-14 ENCOUNTER — Other Ambulatory Visit: Payer: Self-pay

## 2020-05-18 ENCOUNTER — Ambulatory Visit: Payer: BC Managed Care – PPO | Admitting: Nurse Practitioner

## 2020-08-18 DIAGNOSIS — Z780 Asymptomatic menopausal state: Secondary | ICD-10-CM

## 2020-09-10 ENCOUNTER — Encounter: Payer: Self-pay | Admitting: Internal Medicine

## 2020-09-23 LAB — HM MAMMOGRAPHY

## 2020-09-23 LAB — HM DEXA SCAN

## 2020-10-06 ENCOUNTER — Telehealth: Payer: Self-pay | Admitting: Internal Medicine

## 2020-10-06 DIAGNOSIS — M85851 Other specified disorders of bone density and structure, right thigh: Secondary | ICD-10-CM

## 2020-10-06 DIAGNOSIS — M85859 Other specified disorders of bone density and structure, unspecified thigh: Secondary | ICD-10-CM | POA: Insufficient documentation

## 2020-10-21 ENCOUNTER — Encounter: Payer: Self-pay | Admitting: Ophthalmology

## 2020-10-28 ENCOUNTER — Other Ambulatory Visit: Payer: Self-pay

## 2020-10-28 ENCOUNTER — Other Ambulatory Visit
Admission: RE | Admit: 2020-10-28 | Discharge: 2020-10-28 | Disposition: A | Discharge status: Home / Self Care | Payer: BC Managed Care – PPO | Source: Ambulatory Visit | Attending: Ophthalmology | Admitting: Ophthalmology

## 2020-10-28 DIAGNOSIS — Z79899 Other long term (current) drug therapy: Secondary | ICD-10-CM | POA: Diagnosis not present

## 2020-10-28 DIAGNOSIS — H02831 Dermatochalasis of right upper eyelid: Secondary | ICD-10-CM | POA: Diagnosis not present

## 2020-10-28 DIAGNOSIS — Z803 Family history of malignant neoplasm of breast: Secondary | ICD-10-CM | POA: Diagnosis not present

## 2020-10-28 DIAGNOSIS — Z20822 Contact with and (suspected) exposure to covid-19: Secondary | ICD-10-CM | POA: Insufficient documentation

## 2020-10-28 DIAGNOSIS — Z01812 Encounter for preprocedural laboratory examination: Secondary | ICD-10-CM | POA: Insufficient documentation

## 2020-10-28 DIAGNOSIS — H02834 Dermatochalasis of left upper eyelid: Secondary | ICD-10-CM | POA: Diagnosis present

## 2020-10-28 DIAGNOSIS — Z8249 Family history of ischemic heart disease and other diseases of the circulatory system: Secondary | ICD-10-CM | POA: Diagnosis not present

## 2020-10-28 DIAGNOSIS — Z881 Allergy status to other antibiotic agents status: Secondary | ICD-10-CM | POA: Diagnosis not present

## 2020-10-28 DIAGNOSIS — Z823 Family history of stroke: Secondary | ICD-10-CM | POA: Diagnosis not present

## 2020-10-28 LAB — SARS CORONAVIRUS 2 (TAT 6-24 HRS): SARS Coronavirus 2: NEGATIVE

## 2020-10-30 ENCOUNTER — Other Ambulatory Visit: Payer: Self-pay

## 2020-10-30 ENCOUNTER — Ambulatory Visit
Admission: RE | Admit: 2020-10-30 | Discharge: 2020-10-30 | Disposition: A | Discharge status: Home / Self Care | Payer: BC Managed Care – PPO | Attending: Ophthalmology | Admitting: Ophthalmology

## 2020-10-30 ENCOUNTER — Ambulatory Visit: Payer: BC Managed Care – PPO | Admitting: Anesthesiology

## 2020-10-30 ENCOUNTER — Encounter: Payer: Self-pay | Admitting: Ophthalmology

## 2020-10-30 ENCOUNTER — Encounter
Admission: RE | Disposition: A | Discharge status: Home / Self Care | Payer: Self-pay | Source: Home / Self Care | Attending: Ophthalmology

## 2020-10-30 DIAGNOSIS — Z803 Family history of malignant neoplasm of breast: Secondary | ICD-10-CM | POA: Insufficient documentation

## 2020-10-30 DIAGNOSIS — Z79899 Other long term (current) drug therapy: Secondary | ICD-10-CM | POA: Insufficient documentation

## 2020-10-30 DIAGNOSIS — Z8249 Family history of ischemic heart disease and other diseases of the circulatory system: Secondary | ICD-10-CM | POA: Insufficient documentation

## 2020-10-30 DIAGNOSIS — H02834 Dermatochalasis of left upper eyelid: Secondary | ICD-10-CM | POA: Insufficient documentation

## 2020-10-30 DIAGNOSIS — Z20822 Contact with and (suspected) exposure to covid-19: Secondary | ICD-10-CM | POA: Insufficient documentation

## 2020-10-30 DIAGNOSIS — Z881 Allergy status to other antibiotic agents status: Secondary | ICD-10-CM | POA: Insufficient documentation

## 2020-10-30 DIAGNOSIS — H02831 Dermatochalasis of right upper eyelid: Secondary | ICD-10-CM | POA: Insufficient documentation

## 2020-10-30 DIAGNOSIS — Z823 Family history of stroke: Secondary | ICD-10-CM | POA: Insufficient documentation

## 2020-10-30 HISTORY — PX: BROW LIFT: SHX178

## 2020-10-30 HISTORY — DX: Other specified postprocedural states: Z98.890

## 2020-10-30 HISTORY — DX: Other specified postprocedural states: R11.2

## 2020-10-30 SURGERY — BLEPHAROPLASTY
Anesthesia: General | Laterality: Bilateral

## 2020-10-30 MED ORDER — BSS IO SOLN
INTRAOCULAR | Status: DC | PRN
  Administered 2020-10-30: 15 mL

## 2020-10-30 MED ORDER — TETRACAINE HCL 0.5 % OP SOLN
OPHTHALMIC | Status: DC | PRN
  Administered 2020-10-30: 2 [drp] via OPHTHALMIC

## 2020-10-30 MED ORDER — MIDAZOLAM HCL 2 MG/2ML IJ SOLN
INTRAMUSCULAR | Status: DC | PRN
  Administered 2020-10-30: 2 mg via INTRAVENOUS

## 2020-10-30 MED ORDER — OXYCODONE HCL 5 MG/5ML PO SOLN
5.0000 mg | Freq: Once | ORAL | Status: DC | PRN
Start: 2020-10-30 — End: 2020-10-30

## 2020-10-30 MED ORDER — OXYCODONE HCL 5 MG PO TABS
5.0000 mg | ORAL_TABLET | Freq: Once | ORAL | Status: DC | PRN
Start: 2020-10-30 — End: 2020-10-30

## 2020-10-30 MED ORDER — ONDANSETRON HCL 4 MG/2ML IJ SOLN
INTRAMUSCULAR | Status: DC | PRN
  Administered 2020-10-30: 4 mg via INTRAVENOUS

## 2020-10-30 MED ORDER — ARTIFICIAL TEARS OPHTHALMIC OINT
TOPICAL_OINTMENT | OPHTHALMIC | Status: DC | PRN
  Administered 2020-10-30: 1 via OPHTHALMIC

## 2020-10-30 MED ORDER — DEXMEDETOMIDINE HCL 200 MCG/2ML IV SOLN
INTRAVENOUS | Status: DC | PRN
  Administered 2020-10-30: 10 ug via INTRAVENOUS
  Administered 2020-10-30: 5 ug via INTRAVENOUS

## 2020-10-30 MED ORDER — LACTATED RINGERS IV SOLN: INTRAVENOUS | Status: DC

## 2020-10-30 MED ORDER — LIDOCAINE-EPINEPHRINE 2 %-1:100000 IJ SOLN
INTRAMUSCULAR | Status: DC | PRN
  Administered 2020-10-30: 3 mL via OPHTHALMIC

## 2020-10-30 MED ORDER — ONDANSETRON HCL 4 MG/2ML IJ SOLN: 4.0000 mg | Freq: Once | INTRAMUSCULAR | Status: DC | PRN

## 2020-10-30 MED ORDER — FENTANYL CITRATE (PF) 100 MCG/2ML IJ SOLN: 25.0000 ug | INTRAMUSCULAR | Status: DC | PRN

## 2020-10-30 MED ORDER — ALFENTANIL 500 MCG/ML IJ INJ
INJECTION | INTRAVENOUS | Status: DC | PRN
  Administered 2020-10-30: 500 ug via INTRAVENOUS
  Administered 2020-10-30: 250 ug via INTRAVENOUS

## 2020-10-30 MED ORDER — TRAMADOL HCL 50 MG PO TABS: ORAL_TABLET | ORAL | 0 refills | Status: DC

## 2020-10-30 SURGICAL SUPPLY — 22 items
APPLICATOR COTTON TIP WD 3 STR (MISCELLANEOUS) ×2 IMPLANT
BLADE SURG 15 STRL LF DISP TIS (BLADE) ×1 IMPLANT
BLADE SURG 15 STRL SS (BLADE) ×2
CORD BIP STRL DISP 12FT (MISCELLANEOUS) ×2 IMPLANT
DRAPE HEAD BAR (DRAPES) ×2 IMPLANT
GAUZE SPONGE 4X4 12PLY STRL (GAUZE/BANDAGES/DRESSINGS) ×2 IMPLANT
GLOVE SURG LX 7.0 MICRO (GLOVE) ×2
GLOVE SURG LX STRL 7.0 MICRO (GLOVE) ×2 IMPLANT
GOWN STRL REUS W/ TWL LRG LVL3 (GOWN DISPOSABLE) ×1 IMPLANT
GOWN STRL REUS W/TWL LRG LVL3 (GOWN DISPOSABLE) ×2
MARKER SKIN XFINE TIP W/RULER (MISCELLANEOUS) ×2 IMPLANT
NEEDLE FILTER BLUNT 18X 1/2SAF (NEEDLE) ×1
NEEDLE FILTER BLUNT 18X1 1/2 (NEEDLE) ×1 IMPLANT
NEEDLE HYPO 30X.5 LL (NEEDLE) ×4 IMPLANT
PACK ENT CUSTOM (PACKS) ×2 IMPLANT
SOL PREP PVP 2OZ (MISCELLANEOUS) ×2
SOLUTION PREP PVP 2OZ (MISCELLANEOUS) ×1 IMPLANT
SPONGE GAUZE 2X2 8PLY STRL LF (GAUZE/BANDAGES/DRESSINGS) ×10 IMPLANT
SUT GUT PLAIN 6-0 1X18 ABS (SUTURE) ×2 IMPLANT
SYR 10ML LL (SYRINGE) ×2 IMPLANT
SYR 3ML LL SCALE MARK (SYRINGE) ×2 IMPLANT
WATER STERILE IRR 250ML POUR (IV SOLUTION) ×2 IMPLANT

## 2020-10-30 NOTE — Anesthesia Preprocedure Evaluation (Signed)
Anesthesia Evaluation  Patient identified by MRN, date of birth, ID band Patient awake    Reviewed: Allergy & Precautions, H&P , NPO status , Patient's Chart, lab work & pertinent test results, reviewed documented beta blocker date and time   History of Anesthesia Complications (+) PONV and history of anesthetic complications  Airway Mallampati: II  TM Distance: >3 FB Neck ROM: full    Dental no notable dental hx.    Pulmonary neg pulmonary ROS,    Pulmonary exam normal breath sounds clear to auscultation       Cardiovascular Exercise Tolerance: Good negative cardio ROS   Rhythm:regular Rate:Normal     Neuro/Psych negative neurological ROS  negative psych ROS   GI/Hepatic Neg liver ROS, GERD  ,  Endo/Other  negative endocrine ROS  Renal/GU negative Renal ROS  negative genitourinary   Musculoskeletal   Abdominal   Peds  Hematology negative hematology ROS (+)   Anesthesia Other Findings   Reproductive/Obstetrics negative OB ROS                             Anesthesia Physical Anesthesia Plan  ASA: II  Anesthesia Plan: General   Post-op Pain Management:    Induction:   PONV Risk Score and Plan: 4 or greater and Treatment may vary due to age or medical condition, Midazolam and Ondansetron  Airway Management Planned:   Additional Equipment:   Intra-op Plan:   Post-operative Plan:   Informed Consent: I have reviewed the patients History and Physical, chart, labs and discussed the procedure including the risks, benefits and alternatives for the proposed anesthesia with the patient or authorized representative who has indicated his/her understanding and acceptance.     Dental Advisory Given  Plan Discussed with: CRNA  Anesthesia Plan Comments:         Anesthesia Quick Evaluation

## 2020-10-30 NOTE — Interval H&P Note (Signed)
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  The various methods of treatment have been discussed with the patient and family. After consideration of risks, benefits and other options for treatment, the patient has consented to  Procedure(s): BLEPHAROPLASTY UPPER EYELID; W/EXCESS SKIN BILATERAL DIABETIC (Bilateral) as a surgical intervention.  The patient's history has been reviewed, patient examined, no change in status, stable for surgery.  I have reviewed the patient's chart and labs.  Questions were answered to the patient's satisfaction.     Vickki Muff, Cammeron Greis M

## 2020-10-30 NOTE — Discharge Instructions (Signed)
INSTRUCTIONS FOLLOWING OCULOPLASTIC SURGERY Melanie Femia Dennie Maizes, MD  AFTER YOUR EYE SURGERY, THER ARE MANY THINGS WHICH YOU, THE PATIENT, CAN DO TO ASSURE THE BEST POSSIBLE RESULT FROM YOUR OPERATION.  THIS SHEET SHOULD BE REFERRED TO WHENEVER QUESTIONS ARISE.  IF THERE ARE ANY QUESTIONS NOT ANSWERED HERE, DO NOT HESITATE TO CALL OUR OFFICE AT [phone] OR [phone].  THERE IS ALWAYS SOMEONE AVAILABLE TO CALL IF QUESTIONS OR PROBLEMS ARISE.  VISION: Your vision may be blurred and out of focus after surgery until you are able to stop using your ointment, swelling resolves and your eye(s) heal. This may take 1 to 2 weeks at the least.  If your vision becomes gradually more dim or dark, this is not normal and you need to call our office immediately.  EYE CARE: For the first 48 hours after surgery, use ice packs frequently - "20 minutes on, 20 minutes off" - to help reduce swelling and bruising.  Small bags of frozen peas or corn make good ice packs along with cloths soaked in ice water.  If you are wearing a patch or other type of dressing following surgery, keep this on for the amount of time specified by your doctor.  For the first week following surgery, you will need to treat your stitches with great care.  It is OK to shower, but take care to not allow soapy water to run into your eye(s) to help reduce chances of infection.  You may gently clean the eyelashes and around the eye(s) with cotton balls and sterile water, BUT DO NOT RUB THE STITCHES VIGOROUSLY.  Keeping your stitches moist with ointment will help promote healing with minimal scar formation.  ACTIVITY: When you leave the surgery center, you should go home, rest and be inactive.  The eye(s) may feel scratchy and keeping the eyes closed will allow for faster healing.  The first week following surgery, avoid straining (anything making the face turn red) or lifting over 20 pounds.  Additionally, avoid bending which causes your head to go below  your waist.  Using your eyes will NOT harm them, so feel free to read, watch television, use the computer, etc as desired.  Driving depends on each individual, so check with your doctor if you have questions about driving. Do not wear contact lenses for about 2 weeks.  Do not wear eye makeup for 2 weeks.  Avoid swimming, hot tubs, gardening, and dusting for 1 to 2 weeks to reduce the risk of an infection.  MEDICATIONS:  You will be given a prescription for an ointment to use 4 times a day on your stitches BECAUSE YOU HAVE AN ERYTHROMYCIN ALLERGY, YOU SHOULD USE AQUAPHOR OR LACRILUBE ON YOUR SUTURES.  You can use the ointment in your eyes if they feel scratchy or irritated.  If you eyelid(s) don't close completely when you sleep, put some ointment in your eyes before bedtime.  EMERGENCY: If you experience SEVERE EYE PAIN OR HEADACHE UNRELIEVED BY TYLENOL OR TRAMADOL, NAUSEA OR VOMITING, WORSENING REDNESS, OR WORSENING VISION (ESPECIALLY VISION THAT WAS INITIALLY BETTER) CALL [phone] OR [phone] DURING BUSINESS HOURS OR AFTER HOURS. General Anesthesia, Adult, Care After This sheet gives you information about how to care for yourself after your procedure. Your health care provider may also give you more specific instructions. If you have problems or questions, contact your health care provider. What can I expect after the procedure? After the procedure, the following side effects are common:  Pain or discomfort at  the IV site.  Nausea.  Vomiting.  Sore throat.  Trouble concentrating.  Feeling cold or chills.  Feeling weak or tired.  Sleepiness and fatigue.  Soreness and body aches. These side effects can affect parts of the body that were not involved in surgery. Follow these instructions at home: For the time period you were told by your health care provider:  Rest.  Do not participate in activities where you could fall or become injured.  Do not drive or use  machinery.  Do not drink alcohol.  Do not take sleeping pills or medicines that cause drowsiness.  Do not make important decisions or sign legal documents.  Do not take care of children on your own.   Eating and drinking  Follow any instructions from your health care provider about eating or drinking restrictions.  When you feel hungry, start by eating small amounts of foods that are soft and easy to digest (bland), such as toast. Gradually return to your regular diet.  Drink enough fluid to keep your urine pale yellow.  If you vomit, rehydrate by drinking water, juice, or clear broth. General instructions  If you have sleep apnea, surgery and certain medicines can increase your risk for breathing problems. Follow instructions from your health care provider about wearing your sleep device: ? Anytime you are sleeping, including during daytime naps. ? While taking prescription pain medicines, sleeping medicines, or medicines that make you drowsy.  Have a responsible adult stay with you for the time you are told. It is important to have someone help care for you until you are awake and alert.  Return to your normal activities as told by your health care provider. Ask your health care provider what activities are safe for you.  Take over-the-counter and prescription medicines only as told by your health care provider.  If you smoke, do not smoke without supervision.  Keep all follow-up visits as told by your health care provider. This is important. Contact a health care provider if:  You have nausea or vomiting that does not get better with medicine.  You cannot eat or drink without vomiting.  You have pain that does not get better with medicine.  You are unable to pass urine.  You develop a skin rash.  You have a fever.  You have redness around your IV site that gets worse. Get help right away if:  You have difficulty breathing.  You have chest pain.  You have blood  in your urine or stool, or you vomit blood. Summary  After the procedure, it is common to have a sore throat or nausea. It is also common to feel tired.  Have a responsible adult stay with you for the time you are told. It is important to have someone help care for you until you are awake and alert.  When you feel hungry, start by eating small amounts of foods that are soft and easy to digest (bland), such as toast. Gradually return to your regular diet.  Drink enough fluid to keep your urine pale yellow.  Return to your normal activities as told by your health care provider. Ask your health care provider what activities are safe for you. This information is not intended to replace advice given to you by your health care provider. Make sure you discuss any questions you have with your health care provider. Document Revised: 04/16/2020 Document Reviewed: 11/14/2019 Elsevier Patient Education  2021 [employer].

## 2020-10-30 NOTE — Anesthesia Procedure Notes (Signed)
Procedure Name: MAC Date/Time: 10/30/2020 11:19 AM Performed by: Jeannene Patella, CRNA Pre-anesthesia Checklist: Patient identified, Emergency Drugs available, Suction available, Timeout performed and Patient being monitored Patient Re-evaluated:Patient Re-evaluated prior to induction Oxygen Delivery Method: Nasal cannula Placement Confirmation: positive ETCO2

## 2020-10-30 NOTE — Anesthesia Postprocedure Evaluation (Signed)
Anesthesia Post Note  Patient: Melanie Ward  Procedure(s) Performed: BLEPHAROPLASTY UPPER EYELID; W/EXCESS SKIN BILATERAL DIABETIC (Bilateral )     Patient location during evaluation: PACU Anesthesia Type: General Level of consciousness: awake and alert Pain management: pain level controlled Vital Signs Assessment: post-procedure vital signs reviewed and stable Respiratory status: spontaneous breathing, nonlabored ventilation, respiratory function stable and patient connected to nasal cannula oxygen Cardiovascular status: blood pressure returned to baseline and stable Postop Assessment: no apparent nausea or vomiting Anesthetic complications: no   No complications documented.  Alisa Graff

## 2020-10-30 NOTE — Transfer of Care (Signed)
Immediate Anesthesia Transfer of Care Note  Patient: Melanie Ward  Procedure(s) Performed: BLEPHAROPLASTY UPPER EYELID; W/EXCESS SKIN BILATERAL DIABETIC (Bilateral )  Patient Location: PACU  Anesthesia Type: General  Level of Consciousness: awake, alert  and patient cooperative  Airway and Oxygen Therapy: Patient Spontanous Breathing and Patient connected to supplemental oxygen  Post-op Assessment: Post-op Vital signs reviewed, Patient's Cardiovascular Status Stable, Respiratory Function Stable, Patent Airway and No signs of Nausea or vomiting  Post-op Vital Signs: Reviewed and stable  Complications: No complications documented.

## 2020-10-30 NOTE — H&P (Signed)
[place]: [facility]  Primary Care Physician:  Crecencio Mc, MD Ophthalmologist: Dr. Philis Ward. Melanie Ward, M.D.  Pre-Procedure History & Physical: HPI:  Melanie Ward is a 65 y.o. female here for periocular surgery.   Past Medical History:  Diagnosis Date  . Cardiac arrhythmia    pt reports under a lot of stress at the time of diagnosis, unsure of type of dysrhythmia, has not experienced since  . Endometriosis 1992  . GERD (gastroesophageal reflux disease)   . Hearing loss due to cerumen impaction, right 05/30/2017   irrigatio nneeded   . History of chicken pox   . PONV (postoperative nausea and vomiting)    after one of her endemotriosis surgeries    Past Surgical History:  Procedure Laterality Date  . APPENDECTOMY  1973  . LAPAROSCOPIC ENDOMETRIOSIS FULGURATION  1992   endometriosis surgery x 2 in 1992  . TONSILLECTOMY AND ADENOIDECTOMY  1964    Prior to Admission medications   Medication Sig Start Date End Date Taking? Authorizing Provider  Cholecalciferol (VITAMIN D) 2000 UNITS CAPS Take by mouth.   Yes [provider]  famotidine (PEPCID) 40 MG tablet Take 40 mg by mouth once as needed.   Yes [provider]  Multiple Vitamin (MULTIVITAMIN) tablet Take 1 tablet by mouth daily.   Yes [provider]  VITAMIN K PO Take by mouth.   Yes [provider]  triamcinolone cream (KENALOG) 0.1 % Apply 1 application topically 2 (two) times daily. Patient not taking: Reported on 10/21/2020 01/20/20   Crecencio Mc, MD    Allergies as of 03/25/2020 - Review Complete 01/20/2020  Allergen Reaction Noted  . Zithromax [azithromycin] Rash 10/10/2012    Family History  Problem Relation Age of Onset  . Cancer Mother        breast, died at 52  . Hypertension Mother   . Heart disease Father 77  . Heart attack Maternal Grandmother 54  . Stroke Maternal Grandfather     Social History   Socioeconomic History  . Marital status: Married     Spouse name: Not on file  . Number of children: Not on file  . Years of education: Not on file  . Highest education level: Not on file  Occupational History  . Not on file  Tobacco Use  . Smoking status: Never Smoker  . Smokeless tobacco: Never Used  Substance and Sexual Activity  . Alcohol use: No  . Drug use: No  . Sexual activity: Yes    Birth control/protection: Post-menopausal  Other Topics Concern  . Not on file  Social History Narrative  . Not on file   Social Determinants of Health   Financial Resource Strain: Not on file  Food Insecurity: Not on file  Transportation Needs: Not on file  Physical Activity: Not on file  Stress: Not on file  Social Connections: Not on file  Intimate Partner Violence: Not on file    Review of Systems: See HPI, otherwise negative ROS  Physical Exam: Ht 5\' 3"  (1.6 m)   Wt 72.6 kg   BMI 28.34 kg/m  General:   Alert and cooperative in NAD Head:  Normocephalic and atraumatic. Respiratory:  Normal work of breathing.  Impression/Plan: Melanie Ward is here for periocular surgery.  Risks, benefits, limitations, and alternatives regarding surgery have been reviewed with the patient.  Questions have been answered.  All parties agreeable.   Karle Starch, MD  10/30/2020, 11:00 AM

## 2020-10-30 NOTE — Op Note (Signed)
Preoperative Diagnosis:  Visually significant dermatochalasis bilateral  Upper Eyelid(s)  Postoperative Diagnosis:  Same.  Procedure(s) Performed:   Upper eyelid blepharoplasty with excess skin excision  bilateral  Upper Eyelid(s)  Surgeon: Philis Pique. Vickki Muff, M.D.  Assistants: none  Anesthesia: MAC  Specimens: None.  Estimated Blood Loss: Minimal.  Complications: None.  Operative Findings: None Dictated  Procedure:   Allergies were reviewed and the patient is allergic to Zithromax [azithromycin].   After the risks, benefits, complications and alternatives were discussed with the patient, appropriate informed consent was obtained and the patient was brought to the operating suite. The patient was reclined supine and a timeout was conducted.  The patient was then sedated.  Local anesthetic consisting of a 50-50 mixture of 2% lidocaine with epinephrine and 0.75% bupivacaine with added Hylenex was injected subcutaneously to both  upper eyelid(s). After adequate local was instilled, the patient was prepped and draped in the usual sterile fashion for eyelid surgery.   Attention was turned to the upper eyelids. A 4m upper eyelid crease incision line was marked with calipers on both  upper eyelid(s).  A pinch test was used to estimate the amount of excess skin to remove and this was marked in standard blepharoplasty style fashion. Attention was turned to the  right  upper eyelid. A #15 blade was used to open the premarked incision line. A Skin and muscle flap was excised and hemostasis was obtained with bipolar cautery.   Attention was then turned to the opposite eyelid where the same procedure was performed in the same manner. Hemostasis was obtained with bipolar cautery throughout. All incisions were then closed with a combination of running and interrupted 6-0 fast absorbing plain suture. The patient tolerated the procedure well.  Lacrilube ointment was applied to her incision sites,  followed by ice packs. She was taken to the recovery area where she recovered without difficulty.  Post-Op Plan/Instructions:  The patient was instructed to use ice packs frequently for the next 48 hours. She was instructed to use Lacrilube or aquaphor ointment on her incisions 4 times a day for the next 12 to 14 days. She was given a prescription for tramadol (or similar) for pain control should Tylenol not be effective. She was [facility] [place] needed [facility] problems.  Mae Cianci M. FVickki Muff M.D. Ophthalmology

## 2020-11-02 ENCOUNTER — Encounter: Payer: Self-pay | Admitting: Ophthalmology

## 2021-01-20 ENCOUNTER — Ambulatory Visit (INDEPENDENT_AMBULATORY_CARE_PROVIDER_SITE_OTHER): Payer: BC Managed Care – PPO | Admitting: Internal Medicine

## 2021-01-20 ENCOUNTER — Encounter: Payer: Self-pay | Admitting: Internal Medicine

## 2021-01-20 ENCOUNTER — Other Ambulatory Visit: Payer: Self-pay

## 2021-01-20 VITALS — BP 124/80 | HR 89 | Temp 96.4°F | Resp 14 | Ht 63.5 in | Wt 165.2 lb

## 2021-01-20 DIAGNOSIS — E785 Hyperlipidemia, unspecified: Secondary | ICD-10-CM | POA: Diagnosis not present

## 2021-01-20 DIAGNOSIS — Z0001 Encounter for general adult medical examination with abnormal findings: Secondary | ICD-10-CM | POA: Diagnosis not present

## 2021-01-20 DIAGNOSIS — R5383 Other fatigue: Secondary | ICD-10-CM

## 2021-01-20 DIAGNOSIS — M722 Plantar fascial fibromatosis: Secondary | ICD-10-CM | POA: Diagnosis not present

## 2021-01-20 DIAGNOSIS — M85851 Other specified disorders of bone density and structure, right thigh: Secondary | ICD-10-CM

## 2021-01-20 DIAGNOSIS — Z0000 Encounter for general adult medical examination without abnormal findings: Secondary | ICD-10-CM

## 2021-01-20 DIAGNOSIS — E559 Vitamin D deficiency, unspecified: Secondary | ICD-10-CM | POA: Diagnosis not present

## 2021-01-20 DIAGNOSIS — M85852 Other specified disorders of bone density and structure, left thigh: Secondary | ICD-10-CM

## 2021-01-20 DIAGNOSIS — Z8249 Family history of ischemic heart disease and other diseases of the circulatory system: Secondary | ICD-10-CM

## 2021-01-20 DIAGNOSIS — Z8616 Personal history of COVID-19: Secondary | ICD-10-CM

## 2021-01-20 LAB — COMPREHENSIVE METABOLIC PANEL
ALT: 20 U/L (ref 0–35)
AST: 24 U/L (ref 0–37)
Albumin: 4.5 g/dL (ref 3.5–5.2)
Alkaline Phosphatase: 106 U/L (ref 39–117)
BUN: 12 mg/dL (ref 6–23)
CO2: 28 mEq/L (ref 19–32)
Calcium: 9.7 mg/dL (ref 8.4–10.5)
Chloride: 105 mEq/L (ref 96–112)
Creatinine, Ser: 0.72 mg/dL (ref 0.40–1.20)
GFR: 87.05 mL/min (ref >60.00)
Glucose, Bld: 101 mg/dL — ABNORMAL HIGH (ref 70–99)
Potassium: 4.9 mEq/L (ref 3.5–5.1)
Sodium: 140 mEq/L (ref 135–145)
Total Bilirubin: 0.7 mg/dL (ref 0.2–1.2)
Total Protein: 6.7 g/dL (ref 6.0–8.3)

## 2021-01-20 LAB — LIPID PANEL
Cholesterol: 228 mg/dL — ABNORMAL HIGH (ref 0–200)
HDL: 63.4 mg/dL (ref >39.00)
LDL Cholesterol: 139 mg/dL — ABNORMAL HIGH (ref 0–99)
NonHDL: 164.91
Total CHOL/HDL Ratio: 4
Triglycerides: 132 mg/dL (ref 0.0–149.0)
VLDL: 26.4 mg/dL (ref 0.0–40.0)

## 2021-01-20 LAB — VITAMIN D 25 HYDROXY (VIT D DEFICIENCY, FRACTURES): VITD: 43.66 ng/mL (ref 30.00–100.00)

## 2021-01-20 LAB — TSH: TSH: 1.43 u[IU]/mL (ref 0.35–4.50)

## 2021-01-20 NOTE — Progress Notes (Signed)
Patient ID: Melanie Ward, female    DOB: 12/21/1955  Age: 66 y.o. MRN: [id number]  The patient is here for annual preventive  examination and management of other chronic and acute problems.  This visit occurred during the SARS-CoV-2 public health emergency.  Safety protocols were in place, including screening questions prior to the visit, additional usage of staff PPE, and extensive cleaning of exam room while observing appropriate contact time as indicated for disinfecting solutions.     The risk factors are reflected in the social history.  The roster of all physicians providing medical care to patient - is listed in the Snapshot section of the chart.  Activities of daily living:  The patient is 100% independent in all ADLs: dressing, toileting, feeding as well as independent mobility  Home safety : The patient has smoke detectors in the home. They wear seatbelts.  There are no firearms at home. There is no violence in the home.   There is no risks for hepatitis, STDs or HIV. There is no   history of blood transfusion. They have no travel history to infectious disease endemic areas of the world.  The patient has seen their dentist in the last six month. They have seen their eye doctor in the last year. They admit to slight hearing difficulty with regard to whispered voices and some television programs.  They have deferred audiologic testing in the last year.  They do not  have excessive sun exposure. Discussed the need for sun protection: hats, long sleeves and use of sunscreen if there is significant sun exposure.   Diet: the importance of a healthy diet is discussed. They do have a healthy diet.  The benefits of regular aerobic exercise were discussed. She walks 4 times per week ,  20 minutes.   Depression screen: there are no signs or vegative symptoms of depression- irritability, change in appetite, anhedonia, sadness/tearfullness.  Cognitive assessment: the patient manages all their  financial and personal affairs and is actively engaged. They could relate day,date,year and events; recalled 2/3 objects at 3 minutes; performed clock-face test normally.  The following portions of the patient's history were reviewed and updated as appropriate: allergies, current medications, past family history, past medical history,  past surgical history, past social history  and problem list.  Visual acuity was not assessed per patient preference since she has regular follow up with her ophthalmologist. Hearing and body mass index were assessed and reviewed.   During the course of the visit the patient was educated and counseled about appropriate screening and preventive services including : fall prevention , diabetes screening, nutrition counseling, colorectal cancer screening, and recommended immunizations.    CC: The primary encounter diagnosis was Encounter for preventive health examination. Diagnoses of Hyperlipidemia LDL goal <100, Fatigue, unspecified type, Vitamin D deficiency, Family history of early CAD, Personal history of COVID-19, Osteopenia of necks of both femurs, and Plantar fasciitis, bilateral were also pertinent to this visit.  1) NOT EXERCISING DUE TO PERSISTENT ,  DISCUSSED REFERRAL TO [facility] IN [place]  2) RECOVERING FROM COVID.  REFLUX WORSE SINCE INFECTION.3 WEEKS AGO.  GI SYMPTOMS  ANOSMIA,  FATIGUE STILL AN ISSUE   3) PATERNAL HISTORY OF CAD .  FATHER 'S CAD WAS FOUND AT age  18. WANTS TO SEE CARDIOLOGIST .  History (remote) of SVT's  Treated in 2016 at . [facility] cardiology by dr. Einar Gip   History Melanie Ward has a past medical history of Cardiac arrhythmia, Endometriosis (1992), GERD (  gastroesophageal reflux disease), Hearing loss due to cerumen impaction, right (05/30/2017), History of chicken pox, and PONV (postoperative nausea and vomiting).   She has a past surgical history that includes Tonsillectomy and adenoidectomy (1964); Laparoscopic endometriosis  fulguration (1992); Appendectomy (1973); and Brow lift (Bilateral, 10/30/2020).   Her family history includes Cancer in her mother; Heart attack (age of onset: 40) in her maternal grandmother; Heart disease (age of onset: 85) in her father; Hypertension in her mother; Stroke in her maternal grandfather.She reports that she has never smoked. She has never used smokeless tobacco. She reports that she does not drink alcohol and does not use drugs.  Outpatient Medications Prior to Visit  Medication Sig Dispense Refill   Cholecalciferol (VITAMIN D) 2000 UNITS CAPS Take by mouth.     famotidine (PEPCID) 40 MG tablet Take 40 mg by mouth once as needed.     Multiple Vitamin (MULTIVITAMIN) tablet Take 1 tablet by mouth daily.     triamcinolone cream (KENALOG) 0.1 % Apply 1 application topically 2 (two) times daily. 80 g 2   VITAMIN K PO Take by mouth.     traMADol (ULTRAM) 50 MG tablet Take 1 every 4-6 hours as needed for pain not controlled by Tylenol (Patient not taking: Reported on 01/20/2021) 6 tablet 0   No facility-administered medications prior to visit.    Review of Systems  Patient denies headache, fevers, malaise, unintentional weight loss, skin rash, eye pain, sinus congestion and sinus pain, sore throat, dysphagia,  hemoptysis , cough, dyspnea, wheezing, chest pain, palpitations, orthopnea, edema, abdominal pain, nausea, melena, diarrhea, constipation, flank pain, dysuria, hematuria, urinary  Frequency, nocturia, numbness, tingling, seizures,  Focal weakness, Loss of consciousness,  Tremor, insomnia, depression, anxiety, and suicidal ideation.    Objective:  BP 124/80 (BP Location: Left Arm, Patient Position: Sitting, Cuff Size: Normal)   Pulse 89   Temp (!) 96.4 F (35.8 C) (Temporal)   Resp 14   Ht 5' 3.5" (1.613 m)   Wt 165 lb 3.2 oz (74.9 kg)   SpO2 99%   BMI 28.80 kg/m   Physical Exam  General appearance: alert, cooperative and appears stated age Head: Normocephalic, without  obvious abnormality, atraumatic Eyes: conjunctivae/corneas clear. PERRL, EOM's intact. Fundi benign. Ears: normal TM's and external ear canals both ears Nose: Nares normal. Septum midline. Mucosa normal. No drainage or sinus tenderness. Throat: lips, mucosa, and tongue normal; teeth and gums normal Neck: no adenopathy, no carotid bruit, no JVD, supple, symmetrical, trachea midline and thyroid not enlarged, symmetric, no tenderness/mass/nodules Lungs: clear to auscultation bilaterally Breasts: normal appearance, no masses or tenderness Heart: regular rate and rhythm, S1, S2 normal, no murmur, click, rub or gallop Abdomen: soft, non-tender; bowel sounds normal; no masses,  no organomegaly Extremities: extremities normal, atraumatic, no cyanosis or edema Pulses: 2+ and symmetric Skin: Skin color, texture, turgor normal. No rashes or lesions Neurologic: Alert and oriented X 3, normal strength and tone. Normal symmetric reflexes. Normal coordination and gait.      Assessment & Plan:   Problem List Items Addressed This Visit       Unprioritized   Encounter for preventive health examination - Primary    age appropriate education and counseling updated, referrals for preventative services and immunizations addressed, dietary and smoking counseling addressed, most recent labs reviewed.  I have personally reviewed and have noted:   1) the patient's medical and social history 2) The pt's use of alcohol, tobacco, and illicit drugs 3) The patient's current  medications and supplements 4) Functional ability including ADL's, fall risk, home safety risk, hearing and visual impairment 5) Diet and physical activities 6) Evidence for depression or mood disorder 7) The patient's height, weight, and BMI have been recorded in the chart   I have made referrals, and provided counseling and education based on review of the above       Hyperlipidemia LDL goal <100    10 yr is is 5% based on current lipid  panel and other personal factors. No treatment advised. Risk stratification in progress with referral to Dr Berl[facility]   Lab Results  Component Value Date   CHOL 228 (H) 01/20/2021   HDL 63.40 01/20/2021   LDLCALC 139 (H) 01/20/2021   LDLDIRECT 135.6 06/20/2013   TRIG 132.0 01/20/2021   CHOLHDL 4 01/20/2021          Relevant Orders   Lipid panel (Completed)   Personal history of COVID-19    Symptoms of fatigue and anosmia have been slow to resolve,  Infection occurred in mid May 2022        Plantar fasciitis, bilateral    symptoms have recurred after resolving.  and have resulted in decreased participation in exercise. PT referral in progress       Osteopenia of femoral neck    Bone Density scores received, she has osteopenia,  Moderate.  Would repeat in 2 years and consider therapy then if there is a significant change. Continue calcium, vitamin d and weight bearing exercise on a regular basis.         Other Visit Diagnoses     Fatigue, unspecified type       Relevant Orders   TSH (Completed)   Comprehensive metabolic panel (Completed)   Vitamin D deficiency       Relevant Orders   VITAMIN D 25 Hydroxy (Vit-D Deficiency, Fractures) (Completed)   Family history of early CAD       Relevant Orders   Ambulatory referral to Cardiology       I have discontinued Jeremie G. Lucia's traMADol. I am also having her maintain her multivitamin, Vitamin D, triamcinolone cream, VITAMIN K PO, and famotidine.  No orders of the defined types were placed in this encounter.   Medications Discontinued During This Encounter  Medication Reason   traMADol (ULTRAM) 50 MG tablet     Follow-up: No follow-ups on file.   Crecencio Mc, MD

## 2021-01-20 NOTE — Patient Instructions (Addendum)
I will make a referral to Dr Einar Gip for a cardiac evaluation   I will try to make the referral to [facility] in [place] for your plantar fasciitis   Tetanus vaccine is due this fall   Health Maintenance After Age 67 After age 38, you are at a higher risk for certain long-term diseases and infections as well as injuries from falls. Falls are a major cause of broken bones and head injuries in people who are older than age 1. Getting regular preventive care can help to keep you healthy and well. Preventive care includes getting regular testing and making lifestyle changes as recommended by your health care provider. Talk with your health care provider about:  Which screenings and tests you should have. A screening is a test that checks for a disease when you have no symptoms.  A diet and exercise plan that is right for you. What should I know about screenings and tests to prevent falls? Screening and testing are the best ways to find a health problem early. Early diagnosis and treatment give you the best chance of managing medical conditions that are common after age 30. Certain conditions and lifestyle choices may make you more likely to have a fall. Your health care provider may recommend:  Regular vision checks. Poor vision and conditions such as cataracts can make you more likely to have a fall. If you wear glasses, make sure to get your prescription updated if your vision changes.  Medicine review. Work with your health care provider to regularly review all of the medicines you are taking, including over-the-counter medicines. Ask your health care provider about any side effects that may make you more likely to have a fall. Tell your health care provider if any medicines that you take make you feel dizzy or sleepy.  Osteoporosis screening. Osteoporosis is a condition that causes the bones to get weaker. This can make the bones weak and cause them to break more easily.  Blood pressure  screening. Blood pressure changes and medicines to control blood pressure can make you feel dizzy.  Strength and balance checks. Your health care provider may recommend certain tests to check your strength and balance while standing, walking, or changing positions.  Foot health exam. Foot pain and numbness, as well as not wearing proper footwear, can make you more likely to have a fall.  Depression screening. You may be more likely to have a fall if you have a fear of falling, feel emotionally low, or feel unable to do activities that you used to do.  Alcohol use screening. Using too much alcohol can affect your balance and may make you more likely to have a fall. What actions can I take to lower my risk of falls? General instructions  Talk with your health care provider about your risks for falling. Tell your health care provider if: ? You fall. Be sure to tell your health care provider about all falls, even ones that seem minor. ? You feel dizzy, sleepy, or off-balance.  Take over-the-counter and prescription medicines only as told by your health care provider. These include any supplements.  Eat a healthy diet and maintain a healthy weight. A healthy diet includes low-fat dairy products, low-fat (lean) meats, and fiber from whole grains, beans, and lots of fruits and vegetables. Home safety  Remove any tripping hazards, such as rugs, cords, and clutter.  Install safety equipment such as grab bars in bathrooms and safety rails on stairs.  Keep rooms  and walkways well-lit. Activity  Follow a regular exercise program to stay fit. This will help you maintain your balance. Ask your health care provider what types of exercise are appropriate for you.  If you need a cane or walker, use it as recommended by your health care provider.  Wear supportive shoes that have nonskid soles.   Lifestyle  Do not drink alcohol if your health care provider tells you not to drink.  If you drink  alcohol, limit how much you have: ? 0-1 drink a day for women. ? 0-2 drinks a day for men.  Be aware of how much alcohol is in your drink. In the [place] one drink equals one typical bottle of beer (12 oz), one-half glass of wine (5 oz), or one shot of hard liquor (1 oz).  Do not use any products that contain nicotine or tobacco, such as cigarettes and e-cigarettes. If you need help quitting, ask your health care provider. Summary  Having a healthy lifestyle and getting preventive care can help to protect your health and wellness after age 25.  Screening and testing are the best way to find a health problem early and help you avoid having a fall. Early diagnosis and treatment give you the best chance for managing medical conditions that are more common for people who are older than age 34.  Falls are a major cause of broken bones and head injuries in people who are older than age 53. Take precautions to prevent a fall at home.  Work with your health care provider to learn what changes you can make to improve your health and wellness and to prevent falls. This information is not intended to replace advice given to you by your health care provider. Make sure you discuss any questions you have with your health care provider. Document Revised: 11/22/2018 Document Reviewed: 06/14/2017 Elsevier Patient Education  2021 [employer].

## 2021-01-23 NOTE — Assessment & Plan Note (Signed)

## 2021-01-23 NOTE — Assessment & Plan Note (Signed)
Bone Density scores received, she has osteopenia,  Moderate.  Would repeat in 2 years and consider therapy then if there is a significant change. Continue calcium, vitamin d and weight bearing exercise on a regular basis.  

## 2021-01-23 NOTE — Assessment & Plan Note (Signed)
Symptoms of fatigue and anosmia have been slow to resolve,  Infection occurred in mid May 2022

## 2021-01-23 NOTE — Assessment & Plan Note (Signed)
10 yr is is 5% based on current lipid panel and other personal factors. No treatment advised. Risk stratification in progress with referral to Dr [facility]   Lab Results  Component Value Date   CHOL 228 (H) 01/20/2021   HDL 63.40 01/20/2021   LDLCALC 139 (H) 01/20/2021   LDLDIRECT 135.6 06/20/2013   TRIG 132.0 01/20/2021   CHOLHDL 4 01/20/2021

## 2021-01-23 NOTE — Assessment & Plan Note (Signed)
symptoms have recurred after resolving.  and have resulted in decreased participation in exercise. PT referral in progress

## 2021-03-29 LAB — HM DIABETES EYE EXAM

## 2021-06-17 ENCOUNTER — Emergency Department (HOSPITAL_COMMUNITY): Payer: BC Managed Care – PPO

## 2021-06-17 ENCOUNTER — Other Ambulatory Visit: Payer: Self-pay

## 2021-06-17 ENCOUNTER — Emergency Department (HOSPITAL_COMMUNITY)
Admission: EM | Admit: 2021-06-17 | Discharge: 2021-06-17 | Disposition: A | Discharge status: Home / Self Care | Payer: BC Managed Care – PPO | Attending: Emergency Medicine | Admitting: Emergency Medicine

## 2021-06-17 DIAGNOSIS — R0789 Other chest pain: Secondary | ICD-10-CM | POA: Insufficient documentation

## 2021-06-17 DIAGNOSIS — R000 Tachycardia, unspecified: Secondary | ICD-10-CM | POA: Insufficient documentation

## 2021-06-17 DIAGNOSIS — K30 Functional dyspepsia: Secondary | ICD-10-CM | POA: Diagnosis not present

## 2021-06-17 DIAGNOSIS — Z20822 Contact with and (suspected) exposure to covid-19: Secondary | ICD-10-CM | POA: Insufficient documentation

## 2021-06-17 LAB — COMPREHENSIVE METABOLIC PANEL
ALT: 22 U/L (ref 0–44)
AST: 27 U/L (ref 15–41)
Albumin: 4 g/dL (ref 3.5–5.0)
Alkaline Phosphatase: 88 U/L (ref 38–126)
Anion gap: 6 (ref 5–15)
BUN: 9 mg/dL (ref 8–23)
CO2: 27 mmol/L (ref 22–32)
Calcium: 9.4 mg/dL (ref 8.9–10.3)
Chloride: 106 mmol/L (ref 98–111)
Creatinine, Ser: 0.81 mg/dL (ref 0.44–1.00)
GFR, Estimated: >60 mL/min (ref >60)
Glucose, Bld: 104 mg/dL — ABNORMAL HIGH (ref 70–99)
Potassium: 4 mmol/L (ref 3.5–5.1)
Sodium: 139 mmol/L (ref 135–145)
Total Bilirubin: 0.9 mg/dL (ref 0.3–1.2)
Total Protein: 6.4 g/dL — ABNORMAL LOW (ref 6.5–8.1)

## 2021-06-17 LAB — TROPONIN I (HIGH SENSITIVITY)
Troponin I (High Sensitivity): 2 ng/L (ref <18)
Troponin I (High Sensitivity): 3 ng/L (ref <18)

## 2021-06-17 LAB — CBC WITH DIFFERENTIAL/PLATELET
Abs Immature Granulocytes: 0 10*3/uL (ref 0.00–0.07)
Basophils Absolute: 0 10*3/uL (ref 0.0–0.1)
Basophils Relative: 1 %
Eosinophils Absolute: 0.1 10*3/uL (ref 0.0–0.5)
Eosinophils Relative: 1 %
HCT: 38.4 % (ref 36.0–46.0)
Hemoglobin: 12.9 g/dL (ref 12.0–15.0)
Immature Granulocytes: 0 %
Lymphocytes Relative: 42 %
Lymphs Abs: 1.9 10*3/uL (ref 0.7–4.0)
MCH: 31.7 pg (ref 26.0–34.0)
MCHC: 33.6 g/dL (ref 30.0–36.0)
MCV: 94.3 fL (ref 80.0–100.0)
Monocytes Absolute: 0.4 10*3/uL (ref 0.1–1.0)
Monocytes Relative: 9 %
Neutro Abs: 2.1 10*3/uL (ref 1.7–7.7)
Neutrophils Relative %: 47 %
Platelets: 219 10*3/uL (ref 150–400)
RBC: 4.07 MIL/uL (ref 3.87–5.11)
RDW: 12.3 % (ref 11.5–15.5)
WBC: 4.5 10*3/uL (ref 4.0–10.5)
nRBC: 0 % (ref 0.0–0.2)

## 2021-06-17 LAB — D-DIMER, QUANTITATIVE: D-Dimer, Quant: 0.44 ug/mL-FEU (ref 0.00–0.50)

## 2021-06-17 LAB — RESP PANEL BY RT-PCR (FLU A&B, COVID) ARPGX2
Influenza A by PCR: NEGATIVE
Influenza B by PCR: NEGATIVE
SARS Coronavirus 2 by RT PCR: NEGATIVE

## 2021-06-17 LAB — LIPASE, BLOOD: Lipase: 31 U/L (ref 11–51)

## 2021-06-17 MED ORDER — FAMOTIDINE IN NACL 20-0.9 MG/50ML-% IV SOLN
20.0000 mg | Freq: Once | INTRAVENOUS | Status: AC
  Administered 2021-06-17: 20 mg via INTRAVENOUS
  Filled 2021-06-17: qty 50

## 2021-06-17 MED ORDER — ALUM & MAG HYDROXIDE-SIMETH 200-200-20 MG/5ML PO SUSP
15.0000 mL | Freq: Once | ORAL | Status: AC
  Administered 2021-06-17: 15 mL via ORAL
  Filled 2021-06-17: qty 30

## 2021-06-17 MED ORDER — SODIUM CHLORIDE 0.9 % IV BOLUS
1000.0000 mL | Freq: Once | INTRAVENOUS | Status: AC
  Administered 2021-06-17: 1000 mL via INTRAVENOUS

## 2021-06-17 NOTE — ED Provider Notes (Signed)
[facility] Provider Note   CSN: [id number] Arrival date & time: 06/17/21  0844     History Chief Complaint  Patient presents with  . Chest Pain    [facility] Ward is a 65 y.o. female.  This is a 65 y.o. female with significant medical history as below, including SVT who presents to the ED with complaint of left sided chest pain  Location:  left side of sternum, midepigastrium  Duration:  6 hours Onset:  woke up around 0300 with pain, sudden Timing:  constant Description:  aching/tightness, non radiating  Severity:  mild Exacerbating/Alleviating Factors:  mildly improved with ASA this am  Associated Symptoms:  malaise, similar to pain in the past that was attributed to GI upset.  Pertinent Negatives:  no fevers, chills, n/v, change to bowel bladder fxn, no recent travel or sick contacts, no hx dvt/pe, not described as pleuritic, not exertional     The history is provided by the patient. No language interpreter was used.   HPI: A 65 year old patient presents for evaluation of chest pain. Initial onset of pain was approximately 3-6 hours ago. The patient's chest pain is sharp and is not worse with exertion. The patient's chest pain is middle- or left-sided, is not well-localized, is not described as heaviness/pressure/tightness and does not radiate to the arms/jaw/neck. The patient does not complain of nausea and denies diaphoresis. The patient has no history of stroke, has no history of peripheral artery disease, has not smoked in the past 90 days, denies any history of treated diabetes, has no relevant family history of coronary artery disease (first degree relative at less than age 39), is not hypertensive, has no history of hypercholesterolemia and does not have an elevated BMI (>=30).   Past Medical History:  Diagnosis Date  . Cardiac arrhythmia    pt reports under a lot of stress at the time of diagnosis, unsure of type of dysrhythmia,  has not experienced since  . Endometriosis 1992  . GERD (gastroesophageal reflux disease)   . Hearing loss due to cerumen impaction, right 05/30/2017   irrigatio nneeded   . History of chicken pox   . PONV (postoperative nausea and vomiting)    after one of her endemotriosis surgeries    Patient Active Problem List   Diagnosis Date Noted  . Osteopenia of femoral neck 10/06/2020  . Personal history of COVID-19 01/20/2020  . Plantar fasciitis, bilateral 01/20/2020  . Hyperlipidemia LDL goal <100 10/20/2018  . Encounter for preventive health examination 12/20/2013  . Overweight (BMI 25.0-29.9) 10/13/2012  . GERD (gastroesophageal reflux disease) 10/13/2012    Past Surgical History:  Procedure Laterality Date  . APPENDECTOMY  1973  . BROW LIFT Bilateral 10/30/2020   Procedure: BLEPHAROPLASTY UPPER EYELID; W/EXCESS SKIN BILATERAL DIABETIC;  Surgeon: Karle Starch, MD;  Location: [facility];  Service: Ophthalmology;  Laterality: Bilateral;  . LAPAROSCOPIC ENDOMETRIOSIS FULGURATION  1992   endometriosis surgery x 2 in 1992  . TONSILLECTOMY AND ADENOIDECTOMY  1964     OB History   No obstetric history on file.     Family History  Problem Relation Age of Onset  . Cancer Mother        breast, died at 5  . Hypertension Mother   . Heart disease Father 6  . Heart attack Maternal Grandmother 54  . Stroke Maternal Grandfather     Social History   Tobacco Use  . Smoking status: Never  . Smokeless  tobacco: Never  Substance Use Topics  . Alcohol use: No  . Drug use: No    Home Medications Prior to Admission medications   Medication Sig Start Date End Date Taking? Authorizing Provider  Cholecalciferol (VITAMIN D) 2000 UNITS CAPS Take by mouth.    [provider]  famotidine (PEPCID) 40 MG tablet Take 40 mg by mouth once as needed.    [provider]  Multiple Vitamin (MULTIVITAMIN) tablet Take 1 tablet by mouth daily.    [provider]   triamcinolone cream (KENALOG) 0.1 % Apply 1 application topically 2 (two) times daily. 01/20/20   Crecencio Mc, MD  VITAMIN K PO Take by mouth.    [provider]    Allergies    Zithromax [azithromycin]  Review of Systems   Review of Systems  Constitutional:  Negative for chills and fever.  HENT:  Negative for facial swelling and trouble swallowing.   Eyes:  Negative for photophobia and visual disturbance.  Respiratory:  Positive for chest tightness. Negative for cough and shortness of breath.   Cardiovascular:  Positive for chest pain. Negative for palpitations.  Gastrointestinal:  Negative for abdominal pain, nausea and vomiting.  Endocrine: Negative for polydipsia and polyuria.  Genitourinary:  Negative for difficulty urinating and hematuria.  Musculoskeletal:  Negative for gait problem and joint swelling.  Skin:  Negative for pallor and rash.  Neurological:  Negative for syncope and headaches.  Psychiatric/Behavioral:  Negative for agitation and confusion.    Physical Exam Updated Vital Signs BP 129/71   Pulse 80   Temp 98.9 F (37.2 C) (Oral)   Resp 15   Ht 5\' 3"  (1.6 m)   Wt 72.6 kg   SpO2 94%   BMI 28.34 kg/m   Physical Exam Vitals and nursing note reviewed.  Constitutional:      General: She is not in acute distress.    Appearance: Normal appearance. She is well-developed. She is not ill-appearing.  HENT:     Head: Normocephalic and atraumatic.     Right Ear: External ear normal.     Left Ear: External ear normal.     Nose: Nose normal.     Mouth/Throat:     Mouth: Mucous membranes are moist.  Eyes:     General: No scleral icterus.       Right eye: No discharge.        Left eye: No discharge.  Cardiovascular:     Rate and Rhythm: Regular rhythm. Tachycardia present.     Pulses: Normal pulses.     Heart sounds: Normal heart sounds.  Pulmonary:     Effort: Pulmonary effort is normal. No respiratory distress.     Breath sounds: Normal breath  sounds.  Abdominal:     General: Abdomen is flat.     Tenderness: There is no abdominal tenderness.  Musculoskeletal:        General: Normal range of motion.     Cervical back: Normal range of motion.     Right lower leg: No edema.     Left lower leg: No edema.  Skin:    General: Skin is warm and dry.     Capillary Refill: Capillary refill takes less than 2 seconds.  Neurological:     Mental Status: She is alert.  Psychiatric:        Mood and Affect: Mood normal.        Behavior: Behavior normal.    ED Results / Procedures /  Treatments   Labs (all labs ordered are listed, but only abnormal results are displayed) Labs Reviewed  COMPREHENSIVE METABOLIC PANEL - Abnormal; Notable for the following components:      Result Value   Glucose, Bld 104 (*)    Total Protein 6.4 (*)    All other components within normal limits  RESP PANEL BY RT-PCR (FLU A&B, COVID) ARPGX2  CBC WITH DIFFERENTIAL/PLATELET  D-DIMER, QUANTITATIVE  LIPASE, BLOOD  TROPONIN I (HIGH SENSITIVITY)  TROPONIN I (HIGH SENSITIVITY)    EKG EKG Interpretation  Date/Time:  Thursday June 17 2021 09:06:13 EDT Ventricular Rate:  89 PR Interval:  167 QRS Duration: 93 QT Interval:  384 QTC Calculation: 468 R Axis:   77 Text Interpretation: Sinus rhythm Probable left atrial enlargement No stemi Confirmed by Wynona Dove ([id number]) on 06/17/2021 9:15:15 AM  Radiology DG Chest 2 View  Result Date: 06/17/2021 CLINICAL DATA:  Chest pain EXAM: CHEST - 2 VIEW COMPARISON:  Chest x-ray 07/02/2019 FINDINGS: Heart size and mediastinal contours are within normal limits. No suspicious pulmonary opacities identified. No pleural effusion or pneumothorax visualized. No acute osseous abnormality appreciated. IMPRESSION: No acute intrathoracic process identified. Electronically Signed   By: Ofilia Neas M.D.   On: 06/17/2021 09:42    Procedures Procedures   Medications Ordered in ED Medications  famotidine (PEPCID) IVPB 20  mg premix (20 mg Intravenous New Bag/Given 06/17/21 0933)  alum & mag hydroxide-simeth (MAALOX/MYLANTA) 200-200-20 MG/5ML suspension 15 mL (15 mLs Oral Given 06/17/21 0932)  sodium chloride 0.9 % bolus 1,000 mL (1,000 mLs Intravenous New Bag/Given 06/17/21 0933)    ED Course  I have reviewed the triage vital signs and the nursing notes.  Pertinent labs & imaging results that were available during my care of the patient were reviewed by me and considered in my medical decision making (see chart for details).    MDM Rules/Calculators/A&P HEAR Score: 2                         CC: CP  This patient complains of CP; this involves an extensive number of treatment options and is a complaint that carries with it a high risk of complications and morbidity. Vital signs were reviewed. Serious etiologies considered.  Currently symptomatic, she took ASA prior to arrival so will not repeat dose Low risk Well's score, obtain d-dimer  Record review:   Previous records obtained and reviewed   Pt reports prior echo, this is not avail in our EMR   Work up as above, notable for:  Labs & imaging results that were available during my care of the patient were reviewed by me and considered in my medical decision making.   I ordered imaging studies which included CXR and I independently visualized and interpreted imaging which showed no acute process  Management: Given GI cocktail, IVF  EKG without acute ischemia, no STEMI.   Reassessment:   Patient symptoms improved.  Vital signs remained stable.     The patient's chest pain is not suggestive of pulmonary embolus, cardiac ischemia, aortic dissection, pericarditis, myocarditis, pulmonary embolism, pneumothorax, pneumonia, Zoster, or esophageal perforation, or other serious etiology.  Historically not abrupt in onset, tearing or ripping, pulses symmetric. EKG nonspecific for ischemia/infarction. No dysrhythmias, brugada, WPW, prolonged QT noted.  Troponin negative x2. CXR reviewed. Labs without demonstration of acute pathology unless otherwise noted above. Low HEART Score: 0-3 points (0.9-1.7% risk of MACE). Given the extremely low risk of these diagnoses  further testing and evaluation for these possibilities does not appear to be indicated at this time. Patient in no distress and overall condition improved here in the ED. Recommend o/p f/u with her cardiologist. Detailed discussions were had with the patient regarding current findings, and need for close f/u with PCP or on call doctor. The patient has been instructed to return immediately if the symptoms worsen in any way for re-evaluation. Patient verbalized understanding and is in agreement with current care plan. All questions answered prior to discharge.         This chart was dictated using voice recognition software.  Despite best efforts to proofread,  errors can occur which can change the documentation meaning.  Final Clinical Impression(s) / ED Diagnoses Final diagnoses:  Atypical chest pain  Indigestion    Rx / DC Orders ED Discharge Orders     None        Jeanell Sparrow, [facility] 06/17/21 1214

## 2021-06-17 NOTE — ED Triage Notes (Signed)
Pt arrived complaining of chest pain and tightness that started this morning. Pt states pain is now 5/10 after she took an aspirin at home Pt denies N/V and feeling short of breath   Denies cardiac hx

## 2021-07-01 ENCOUNTER — Encounter: Payer: Self-pay | Admitting: Cardiology

## 2021-07-01 ENCOUNTER — Ambulatory Visit: Payer: BC Managed Care – PPO | Admitting: Cardiology

## 2021-07-01 ENCOUNTER — Other Ambulatory Visit: Payer: Self-pay

## 2021-07-01 VITALS — BP 137/78 | HR 87 | Temp 98.7°F | Resp 16 | Ht 63.0 in | Wt 168.2 lb

## 2021-07-01 DIAGNOSIS — I10 Essential (primary) hypertension: Secondary | ICD-10-CM

## 2021-07-01 DIAGNOSIS — I2510 Atherosclerotic heart disease of native coronary artery without angina pectoris: Secondary | ICD-10-CM

## 2021-07-01 DIAGNOSIS — R0609 Other forms of dyspnea: Secondary | ICD-10-CM

## 2021-07-01 NOTE — Progress Notes (Signed)
Primary Physician/Referring:  Crecencio Mc, MD  Patient ID: Melanie Ward, female    DOB: 01/09/1956, 65 y.o.   MRN: [id number]  Chief Complaint  Patient presents with   Coronary Artery Disease   New Patient (Initial Visit)    Referred by Deborra Medina, MD   HPI:    Melanie Ward  is a 65 y.o. Caucasian female with history of paroxysmal SVT, hyperlipidemia, GERD, prediabetes.  Patient also notes family history of CAD including father with MI at age 64.   Patient was recently evaluated in [facility] emergency department for atypical chest pain on 06/17/2021. Work-up in the emergency department at that time was unremarkable with nonischemic EKG EKG, normal chest x-ray, negative serial troponins, and negative D-dimer.  She now presents to establish care with our office referred by PCP given patient's family history of CAD.  Patient walks 20 to 30 minutes 2-3 times per week typically without issue, however she does report when going uphill she becomes short of breath requiring her to take a short break.  Patient's primary concern today is that over the last several months she has had intermittent episodes of epigastric pain which is usually resolved by Pepcid.  However on 06/17/2021 and once since then she has had epigastric pain that is not resolved with Pepcid.  On 11/3 is when she went to [facility] emergency department for further evaluation, which was relatively unremarkable.  Patient states this discomfort typically last several hours and occurs both at rest and with exertion.  She denies associated symptoms.  Denies dyspnea, syncope, near syncope, palpitations.  Past Medical History:  Diagnosis Date   Cardiac arrhythmia    pt reports under a lot of stress at the time of diagnosis, unsure of type of dysrhythmia, has not experienced since   Endometriosis 1992   GERD (gastroesophageal reflux disease)    Hearing loss due to cerumen impaction, right 05/30/2017   irrigatio nneeded     History of chicken pox    PONV (postoperative nausea and vomiting)    after one of her endemotriosis surgeries   Past Surgical History:  Procedure Laterality Date   APPENDECTOMY  1973   BROW LIFT Bilateral 10/30/2020   Procedure: BLEPHAROPLASTY UPPER EYELID; W/EXCESS SKIN BILATERAL DIABETIC;  Surgeon: Karle Starch, MD;  Location: [place];  Service: Ophthalmology;  Laterality: Bilateral;   LAPAROSCOPIC ENDOMETRIOSIS FULGURATION  1992   endometriosis surgery x 2 in [place]   Family History  Problem Relation Age of Onset   Cancer Mother        breast, died at 55   Hypertension Mother    Heart disease Father 74   Heart attack Maternal Grandmother 54   Stroke Maternal Grandfather     Social History   Tobacco Use   Smoking status: Never   Smokeless tobacco: Never  Substance Use Topics   Alcohol use: No   Marital Status: Married   ROS  Review of Systems  Constitutional: Negative for malaise/fatigue and weight gain.  Cardiovascular:  Positive for chest pain (atypical) and dyspnea on exertion (mild). Negative for claudication, leg swelling, near-syncope, orthopnea, palpitations, paroxysmal nocturnal dyspnea and syncope.  Neurological:  Negative for dizziness.   Objective  Blood pressure 137/78, pulse 87, temperature 98.7 F (37.1 C), temperature source Temporal, resp. rate 16, height 5\' 3"  (1.6 m), weight 168 lb 3.2 oz (76.3 kg), SpO2 96 %.  Vitals with BMI 07/01/2021 06/17/2021  06/17/2021  Height 5\' 3"  - -  Weight 168 lbs 3 oz - -  BMI 02.7 - -  Systolic 741 287 867  Diastolic 78 69 71  Pulse 87 81 80      Physical Exam Vitals reviewed.  Cardiovascular:     Rate and Rhythm: Normal rate and regular rhythm.     Pulses: Intact distal pulses.     Heart sounds: S1 normal and S2 normal. No murmur heard.   No gallop.  Pulmonary:     Effort: Pulmonary effort is normal. No respiratory distress.     Breath sounds: No wheezing,  rhonchi or rales.  Musculoskeletal:     Right lower leg: No edema.     Left lower leg: No edema.  Neurological:     Mental Status: She is alert.    Laboratory examination:   Recent Labs    01/20/21 1026 06/17/21 0903  NA 140 139  K 4.9 4.0  CL 105 106  CO2 28 27  GLUCOSE 101* 104*  BUN 12 9  CREATININE 0.72 0.81  CALCIUM 9.7 9.4  GFRNONAA  --  >60   estimated creatinine clearance is 66.9 mL/min (by C-G formula based on SCr of 0.81 mg/dL).  CMP Latest Ref Rng & Units 06/17/2021 01/20/2021 01/20/2020  Glucose 70 - 99 mg/dL 104(H) 101(H) 104(H)  BUN 8 - 23 mg/dL 9 12 14   Creatinine 0.44 - 1.00 mg/dL 0.81 0.72 0.67  Sodium 135 - 145 mmol/L 139 140 139  Potassium 3.5 - 5.1 mmol/L 4.0 4.9 4.3  Chloride 98 - 111 mmol/L 106 105 104  CO2 22 - 32 mmol/L 27 28 29   Calcium 8.9 - 10.3 mg/dL 9.4 9.7 9.6  Total Protein 6.5 - 8.1 g/dL 6.4(L) 6.7 6.8  Total Bilirubin 0.3 - 1.2 mg/dL 0.9 0.7 0.5  Alkaline Phos 38 - 126 U/L 88 106 98  AST 15 - 41 U/L 27 24 23   ALT 0 - 44 U/L 22 20 18    CBC Latest Ref Rng & Units 06/17/2021 07/02/2019 10/18/2018  WBC 4.0 - 10.5 K/uL 4.5 5.4 4.2  Hemoglobin 12.0 - 15.0 g/dL 12.9 12.6 12.9  Hematocrit 36.0 - 46.0 % 38.4 37.4 38.1  Platelets 150 - 400 K/uL 219 206 199.0    Lipid Panel Recent Labs    01/20/21 1026  CHOL 228*  TRIG 132.0  LDLCALC 139*  VLDL 26.4  HDL 63.40  CHOLHDL 4    HEMOGLOBIN A1C Lab Results  Component Value Date   HGBA1C 5.9 05/30/2017   TSH Recent Labs    01/20/21 1026  TSH 1.43    External labs:   None  Allergies   Allergies  Allergen Reactions   Zithromax [Azithromycin] Rash    Medications Prior to Visit:   Outpatient Medications Prior to Visit  Medication Sig Dispense Refill   Cholecalciferol (VITAMIN D) 2000 UNITS CAPS Take by mouth.     famotidine (PEPCID) 40 MG tablet Take 40 mg by mouth once as needed.     Multiple Vitamin (MULTIVITAMIN) tablet Take 1 tablet by mouth daily.     VITAMIN K PO Take by  mouth.     triamcinolone cream (KENALOG) 0.1 % Apply 1 application topically 2 (two) times daily. 80 g 2   No facility-administered medications prior to visit.   Final Medications at End of Visit    Current Meds  Medication Sig   Cholecalciferol (VITAMIN D) 2000 UNITS CAPS Take by mouth.   famotidine (PEPCID)  40 MG tablet Take 40 mg by mouth once as needed.   Multiple Vitamin (MULTIVITAMIN) tablet Take 1 tablet by mouth daily.   VITAMIN K PO Take by mouth.   Radiology:   No results found.  Cardiac Studies:   None  EKG:   07/01/2021: Sinus rhythm at a rate of 84 bpm.  Normal axis.  Left atrial enlargement.  Voltage complexes, consider pulmonary disease pattern.  No evidence of ischemia or underlying injury pattern.  Assessment     ICD-10-CM   1. Essential hypertension  I10     2. Coronary artery disease involving native coronary artery of native heart without angina pectoris  I25.10 EKG 12-Lead    3. Dyspnea on exertion  R06.09 PCV ECHOCARDIOGRAM COMPLETE       Medications Discontinued During This Encounter  Medication Reason   triamcinolone cream (KENALOG) 0.1 %     No orders of the defined types were placed in this encounter.   Recommendations:   Melanie Ward is a 65 y.o. Caucasian female with history of paroxysmal SVT, hyperlipidemia, GERD, prediabetes.  Patient also notes family history of CAD including father with MI at age 39.   Patient presents for evaluation of epigastric pain, which is suggestive of GERD as underlying etiology.  However given patient's ongoing symptoms as well as mild dyspnea on exertion in combination with multiple cardiovascular risk factors including hyperlipidemia, prediabetes recommend further evaluation.  Will obtain echocardiogram to evaluate for underlying structural or functional abnormalities, particularly given dyspnea.  We will also obtain coronary calcium score to further risk stratify patient.  If coronary calcium score is  elevated could consider stress test, although I have a low suspicion patient's symptoms are related to underlying cardiac etiology.  I personally reviewed records from recent emergency department evaluation and reviewed these with patient, she verbalized understanding and her questions were addressed.   Further recommendations pending cardiac testing.   Alethia Berthold, PA-C 07/01/2021, 3:33 PM Office: [phone]

## 2021-07-07 ENCOUNTER — Ambulatory Visit: Payer: BC Managed Care – PPO

## 2021-07-07 ENCOUNTER — Other Ambulatory Visit: Payer: Self-pay

## 2021-07-07 DIAGNOSIS — R0609 Other forms of dyspnea: Secondary | ICD-10-CM

## 2021-07-22 ENCOUNTER — Ambulatory Visit
Admission: RE | Admit: 2021-07-22 | Discharge: 2021-07-22 | Disposition: A | Discharge status: Home / Self Care | Payer: BC Managed Care – PPO | Source: Ambulatory Visit | Attending: Student | Admitting: Student

## 2021-07-23 ENCOUNTER — Other Ambulatory Visit: Payer: Self-pay | Admitting: Student

## 2021-07-23 DIAGNOSIS — R931 Abnormal findings on diagnostic imaging of heart and coronary circulation: Secondary | ICD-10-CM

## 2021-07-23 DIAGNOSIS — R0609 Other forms of dyspnea: Secondary | ICD-10-CM

## 2021-08-03 ENCOUNTER — Encounter: Payer: Self-pay | Admitting: Internal Medicine

## 2021-08-03 ENCOUNTER — Ambulatory Visit (INDEPENDENT_AMBULATORY_CARE_PROVIDER_SITE_OTHER): Payer: BC Managed Care – PPO | Admitting: Internal Medicine

## 2021-08-03 ENCOUNTER — Other Ambulatory Visit: Payer: Self-pay

## 2021-08-03 VITALS — BP 128/72 | HR 78 | Temp 98.2°F | Ht 63.0 in | Wt 165.0 lb

## 2021-08-03 DIAGNOSIS — K219 Gastro-esophageal reflux disease without esophagitis: Secondary | ICD-10-CM | POA: Diagnosis not present

## 2021-08-03 DIAGNOSIS — R7301 Impaired fasting glucose: Secondary | ICD-10-CM | POA: Diagnosis not present

## 2021-08-03 DIAGNOSIS — K2900 Acute gastritis without bleeding: Secondary | ICD-10-CM | POA: Diagnosis not present

## 2021-08-03 DIAGNOSIS — E785 Hyperlipidemia, unspecified: Secondary | ICD-10-CM

## 2021-08-03 LAB — LIPID PANEL
Cholesterol: 192 mg/dL (ref 0–200)
HDL: 62.1 mg/dL (ref >39.00)
LDL Cholesterol: 105 mg/dL — ABNORMAL HIGH (ref 0–99)
NonHDL: 130.02
Total CHOL/HDL Ratio: 3
Triglycerides: 125 mg/dL (ref 0.0–149.0)
VLDL: 25 mg/dL (ref 0.0–40.0)

## 2021-08-03 LAB — COMPREHENSIVE METABOLIC PANEL
ALT: 18 U/L (ref 0–35)
AST: 23 U/L (ref 0–37)
Albumin: 4.4 g/dL (ref 3.5–5.2)
Alkaline Phosphatase: 94 U/L (ref 39–117)
BUN: 10 mg/dL (ref 6–23)
CO2: 29 mEq/L (ref 19–32)
Calcium: 9.6 mg/dL (ref 8.4–10.5)
Chloride: 104 mEq/L (ref 96–112)
Creatinine, Ser: 0.7 mg/dL (ref 0.40–1.20)
GFR: 89.71 mL/min (ref >60.00)
Glucose, Bld: 104 mg/dL — ABNORMAL HIGH (ref 70–99)
Potassium: 4.1 mEq/L (ref 3.5–5.1)
Sodium: 138 mEq/L (ref 135–145)
Total Bilirubin: 0.5 mg/dL (ref 0.2–1.2)
Total Protein: 6.6 g/dL (ref 6.0–8.3)

## 2021-08-03 LAB — HEMOGLOBIN A1C: Hgb A1c MFr Bld: 6 % (ref 4.6–6.5)

## 2021-08-03 NOTE — Assessment & Plan Note (Addendum)
With epigastric pain suggestive of gastritis.  PPi prescribed .  H Pylori screening is negative

## 2021-08-03 NOTE — Progress Notes (Signed)
Subjective:  Patient ID: Melanie Ward, female    DOB: 1956-04-30  Age: 65 y.o. MRN: [id number]  CC: The primary encounter diagnosis was Hyperlipidemia LDL goal <70. Diagnoses of Impaired fasting glucose, Acute gastritis without hemorrhage, unspecified gastritis type, Hyperlipidemia LDL goal <100, and Gastroesophageal reflux disease without esophagitis were also pertinent to this visit.  HPI Melanie Ward presents for  ER follow up Chief Complaint  Patient presents with   Acute Visit    Digestive issues. Pt states that she has been experiencing some acid reflux, abdominal bloating and noticed that her bowels have changed some. She stated that the bowels have been "sluggish", and sometimes loose.     Melanie Ward is a 65 yr old female with a history of GERD who was evaluated in the ER recently for left chest pain that occurred at rest on Nov 3.  She ruled out for AMI but was referred to cardiology and  has undergone screening with a coronary CT . Her Calcium score was unexpectedly elevated at  678 which is at the 96th percentile for the patient's age, sex and race.  She has a FH of early CAD.  A Stress test is scheduled .  She is not taking a statin.   She continues to wake  up with epigastric discomfort  several days per week,  the resolves with activity and drinking water  and is described as feeling  like a heavy rock .  Not brought on with walking,  but notes that she has dyspnea and chest heaviness with uphill walks.  No other symptoms of angina   Not taking famotidine or omeprazole on a regular basis  Exam:  epigastric tenderness,  normal BS no masses   Outpatient Medications Prior to Visit  Medication Sig Dispense Refill   Cholecalciferol (VITAMIN D) 2000 UNITS CAPS Take by mouth.     famotidine (PEPCID) 40 MG tablet Take 40 mg by mouth once as needed.     Multiple Vitamin (MULTIVITAMIN) tablet Take 1 tablet by mouth daily.     VITAMIN K PO Take by mouth.     No facility-administered  medications prior to visit.    Review of Systems;  Patient denies headache, fevers, unintentional weight loss, skin rash, eye pain, sinus congestion and sinus pain, sore throat, dysphagia,  hemoptysis , cough, dyspnea, wheezing,  , palpitations, orthopnea, edema,  nausea, melena, diarrhea, constipation, flank pain, dysuria, hematuria, urinary  Frequency, nocturia, numbness, tingling, seizures,  Focal weakness, Loss of consciousness,  Tremor, insomnia, depression, anxiety, and suicidal ideation.      Objective:  BP 128/72 (BP Location: Left Arm, Patient Position: Sitting, Cuff Size: Normal)    Pulse 78    Temp 98.2 F (36.8 C) (Oral)    Ht 5\' 3"  (1.6 m)    Wt 165 lb (74.8 kg)    SpO2 98%    BMI 29.23 kg/m   BP Readings from Last 3 Encounters:  08/03/21 128/72  07/01/21 137/78  06/17/21 130/69    Wt Readings from Last 3 Encounters:  08/03/21 165 lb (74.8 kg)  07/01/21 168 lb 3.2 oz (76.3 kg)  06/17/21 160 lb (72.6 kg)    General appearance: alert, cooperative and appears stated age Ears: normal TM's and external ear canals both ears Throat: lips, mucosa, and tongue normal; teeth and gums normal Neck: no adenopathy, no carotid bruit, supple, symmetrical, trachea midline and thyroid not enlarged, symmetric, no tenderness/mass/nodules Back: symmetric, no curvature. ROM normal. No CVA  tenderness. Lungs: clear to auscultation bilaterally Heart: regular rate and rhythm, S1, S2 normal, no murmur, click, rub or gallop Abdomen: soft, tender,  in the mid epigastric region  without rebound , masses  or organomegaly Pulses: 2+ and symmetric Skin: Skin color, texture, turgor normal. No rashes or lesions Lymph nodes: Cervical, supraclavicular, and axillary nodes normal.  Lab Results  Component Value Date   HGBA1C 6.0 08/03/2021   HGBA1C 5.9 05/30/2017   HGBA1C 5.9 06/20/2013    Lab Results  Component Value Date   CREATININE 0.70 08/03/2021   CREATININE 0.81 06/17/2021   CREATININE  0.72 01/20/2021    Lab Results  Component Value Date   WBC 4.5 06/17/2021   HGB 12.9 06/17/2021   HCT 38.4 06/17/2021   PLT 219 06/17/2021   GLUCOSE 104 (H) 08/03/2021   CHOL 192 08/03/2021   TRIG 125.0 08/03/2021   HDL 62.10 08/03/2021   LDLDIRECT 135.6 06/20/2013   LDLCALC 105 (H) 08/03/2021   ALT 18 08/03/2021   AST 23 08/03/2021   NA 138 08/03/2021   K 4.1 08/03/2021   CL 104 08/03/2021   CREATININE 0.70 08/03/2021   BUN 10 08/03/2021   CO2 29 08/03/2021   TSH 1.43 01/20/2021   HGBA1C 6.0 08/03/2021    CT CARDIAC SCORING (SELF PAY ONLY)  Result Date: 07/23/2021 CLINICAL DATA:  65 year old Caucasian female with family history of heart disease. EXAM: CT CARDIAC CORONARY ARTERY CALCIUM SCORE TECHNIQUE: Non-contrast imaging through the heart was performed using prospective ECG gating. Image post processing was performed on an independent workstation, allowing for quantitative analysis of the heart and coronary arteries. Note that this exam targets the heart and the chest was not imaged in its entirety. COMPARISON:  None. FINDINGS: CORONARY CALCIUM SCORES: Left Main: 0 LAD: 324 LCx: 205 RCA: 149 Total Agatston Score: 678 MESA database percentile: 96 AORTA MEASUREMENTS: Ascending Aorta: 30 mm Descending Aorta: 24 mm OTHER FINDINGS: The heart size is within normal limits. No pericardial fluid is identified. Visualized segments of the thoracic aorta and central pulmonary arteries are normal in caliber. Visualized mediastinum and hilar regions demonstrate no lymphadenopathy or masses. Visualized lungs show no evidence of pulmonary edema, consolidation, pneumothorax, nodule or pleural fluid. Visualized upper abdomen and bony structures are unremarkable. IMPRESSION: Coronary calcium score 678 is at the 96th percentile for the patient's age, sex and race. Electronically Signed   By: Aletta Edouard M.D.   On: 07/23/2021 07:55    Assessment & Plan:   Problem List Items Addressed This Visit      GERD (gastroesophageal reflux disease)    With epigastric pain suggestive of gastritis.  PPi prescribed .  H Pylori screening is negative      Hyperlipidemia LDL goal <100    Given her high calcium score  I have strongly recommended prophylactic use of high potency statin regardless of lipid levels,  but she remains apprehensive of statin use .    Lab Results  Component Value Date   CHOL 192 08/03/2021   HDL 62.10 08/03/2021   LDLCALC 105 (H) 08/03/2021   LDLDIRECT 135.6 06/20/2013   TRIG 125.0 08/03/2021   CHOLHDL 3 08/03/2021  Other Visit Diagnoses     Hyperlipidemia LDL goal <70    -  Primary   Relevant Orders   Lipid panel (Completed)   Comprehensive metabolic panel (Completed)   Impaired fasting glucose       Relevant Orders   Hemoglobin A1c (Completed)   Acute gastritis without hemorrhage, unspecified gastritis type       Relevant Orders   H Pylori, IGM, IGG, IGA AB (Completed)       I provided  40 minutes of  face-to-face time during this encounter reviewing patient's current problems , recent ER visits,  labs and imaging studies, providing counseling on the above mentioned problems , and coordination  of care .   Follow-up: Return in about 3 months (around 11/01/2021).   Crecencio Mc, MD

## 2021-08-03 NOTE — Patient Instructions (Signed)
Checking cholesterol,  H pylori,  liver enzymes and A1c today   I recommend starting omeprazole 40 mg daily in the morning to treat gastritis   Please consider starting a high potency statin to PREVENT events related to placque rupture

## 2021-08-03 NOTE — Assessment & Plan Note (Addendum)
Given her high calcium score  I have strongly recommended prophylactic use of high potency statin regardless of lipid levels,  but she remains apprehensive of statin use .    Lab Results  Component Value Date   CHOL 192 08/03/2021   HDL 62.10 08/03/2021   LDLCALC 105 (H) 08/03/2021   LDLDIRECT 135.6 06/20/2013   TRIG 125.0 08/03/2021   CHOLHDL 3 08/03/2021

## 2021-08-05 LAB — H PYLORI, IGM, IGG, IGA AB
H pylori, IgM Abs: <9 units (ref 0.0–8.9)
H. pylori, IgA Abs: <9 units (ref 0.0–8.9)
H. pylori, IgG AbS: 0.1 Index Value (ref 0.00–0.79)

## 2021-08-25 ENCOUNTER — Ambulatory Visit: Payer: BC Managed Care – PPO

## 2021-08-25 ENCOUNTER — Other Ambulatory Visit: Payer: Self-pay

## 2021-08-25 DIAGNOSIS — R931 Abnormal findings on diagnostic imaging of heart and coronary circulation: Secondary | ICD-10-CM

## 2021-08-25 DIAGNOSIS — R0609 Other forms of dyspnea: Secondary | ICD-10-CM

## 2021-08-26 NOTE — Progress Notes (Signed)
Primary Physician/Referring:  Crecencio Mc, MD  Patient ID: Melanie Ward, female    DOB: Apr 05, 1956, 66 y.o.   MRN: [id number]  Chief Complaint  Patient presents with   Hypertension   Follow-up   Results   HPI:    Melanie Ward  is a 66 y.o. Caucasian female with history of paroxysmal SVT, hyperlipidemia, GERD, prediabetes.  Patient also notes family history of CAD including father with MI at age 5.   Patient was evaluated in [facility] emergency department for atypical chest pain on 06/17/2021. Work-up in the emergency department at that time was unremarkable with nonischemic EKG EKG, normal chest x-ray, negative serial troponins, and negative D-dimer.  She then presented to establish care with our office 07/01/2021.  At that time ordered echocardiogram and coronary calcium score.  Echocardiogram revealed LVEF 60-65%, mild MVP with mild MR, no other significant abnormalities.  Coronary calcium scoring revealed total score of 678 placing patient in the 96 percentile, patient therefore subsequently underwent nuclear stress testing.  Stress test revealed normal myocardial perfusion and was overall low risk.  Patient now presents for follow-up for results of cardiac testing.  Since starting omeprazole per her GI provider patient has had no recurrence of chest pain.  Overall she is feeling well and has recently switched to a plant-based diet.  Denies chest pain, palpitations, syncope, near syncope, dyspnea.  Past Medical History:  Diagnosis Date   Cardiac arrhythmia    pt reports under a lot of stress at the time of diagnosis, unsure of type of dysrhythmia, has not experienced since   Endometriosis 1992   GERD (gastroesophageal reflux disease)    Hearing loss due to cerumen impaction, right 05/30/2017   irrigatio nneeded    History of chicken pox    PONV (postoperative nausea and vomiting)    after one of her endemotriosis surgeries   Past Surgical History:  Procedure Laterality  Date   APPENDECTOMY  1973   BROW LIFT Bilateral 10/30/2020   Procedure: BLEPHAROPLASTY UPPER EYELID; W/EXCESS SKIN BILATERAL DIABETIC;  Surgeon: Karle Starch, MD;  Location: [place];  Service: Ophthalmology;  Laterality: Bilateral;   LAPAROSCOPIC ENDOMETRIOSIS FULGURATION  1992   endometriosis surgery x 2 in [place]   Family History  Problem Relation Age of Onset   Cancer Mother        breast, died at 59   Hypertension Mother    Heart disease Father 30   Heart attack Maternal Grandmother 54   Stroke Maternal Grandfather     Social History   Tobacco Use   Smoking status: Never   Smokeless tobacco: Never  Substance Use Topics   Alcohol use: No   Marital Status: Married   ROS  Review of Systems  Constitutional: Negative for malaise/fatigue and weight gain.  Cardiovascular:  Negative for chest pain, claudication, dyspnea on exertion, leg swelling, near-syncope, orthopnea, palpitations, paroxysmal nocturnal dyspnea and syncope.  Neurological:  Negative for dizziness.   Objective  Blood pressure 120/75, pulse 77, temperature 98 F (36.7 C), temperature source Temporal, height 5\' 3"  (1.6 m), weight 161 lb (73 kg), SpO2 98 %.  Vitals with BMI 08/27/2021 08/03/2021 07/01/2021  Height 5\' 3"  5\' 3"  5\' 3"   Weight 161 lbs 165 lbs 168 lbs 3 oz  BMI 28.53 40.98 11.9  Systolic 147 829 562  Diastolic 75 72 78  Pulse 77 78 87      Physical Exam Vitals  reviewed.  Cardiovascular:     Rate and Rhythm: Normal rate and regular rhythm.     Pulses: Intact distal pulses.     Heart sounds: S1 normal and S2 normal. No murmur heard.   No gallop.  Pulmonary:     Effort: Pulmonary effort is normal. No respiratory distress.     Breath sounds: No wheezing, rhonchi or rales.  Musculoskeletal:     Right lower leg: No edema.     Left lower leg: No edema.  Neurological:     Mental Status: She is alert.  Physical exam unchanged compared to previous  office visit.  Laboratory examination:   Recent Labs    01/20/21 1026 06/17/21 0903 08/03/21 1046  NA 140 139 138  K 4.9 4.0 4.1  CL 105 106 104  CO2 28 27 29   GLUCOSE 101* 104* 104*  BUN 12 9 10   CREATININE 0.72 0.81 0.70  CALCIUM 9.7 9.4 9.6  GFRNONAA  --  >60  --    CrCl cannot be calculated (Patient's most recent lab result is older than the maximum 21 days allowed.).  CMP Latest Ref Rng & Units 08/03/2021 06/17/2021 01/20/2021  Glucose 70 - 99 mg/dL 104(H) 104(H) 101(H)  BUN 6 - 23 mg/dL 10 9 12   Creatinine 0.40 - 1.20 mg/dL 0.70 0.81 0.72  Sodium 135 - 145 mEq/L 138 139 140  Potassium 3.5 - 5.1 mEq/L 4.1 4.0 4.9  Chloride 96 - 112 mEq/L 104 106 105  CO2 19 - 32 mEq/L 29 27 28   Calcium 8.4 - 10.5 mg/dL 9.6 9.4 9.7  Total Protein 6.0 - 8.3 g/dL 6.6 6.4(L) 6.7  Total Bilirubin 0.2 - 1.2 mg/dL 0.5 0.9 0.7  Alkaline Phos 39 - 117 U/L 94 88 106  AST 0 - 37 U/L 23 27 24   ALT 0 - 35 U/L 18 22 20    CBC Latest Ref Rng & Units 06/17/2021 07/02/2019 10/18/2018  WBC 4.0 - 10.5 K/uL 4.5 5.4 4.2  Hemoglobin 12.0 - 15.0 g/dL 12.9 12.6 12.9  Hematocrit 36.0 - 46.0 % 38.4 37.4 38.1  Platelets 150 - 400 K/uL 219 206 199.0    Lipid Panel Recent Labs    01/20/21 1026 08/03/21 1046  CHOL 228* 192  TRIG 132.0 125.0  LDLCALC 139* 105*  VLDL 26.4 25.0  HDL 63.40 62.10  CHOLHDL 4 3    HEMOGLOBIN A1C Lab Results  Component Value Date   HGBA1C 6.0 08/03/2021   TSH Recent Labs    01/20/21 1026  TSH 1.43   External labs:   None  Allergies   Allergies  Allergen Reactions   Zithromax [Azithromycin] Rash    Medications Prior to Visit:   Outpatient Medications Prior to Visit  Medication Sig Dispense Refill   Cholecalciferol (VITAMIN D) 2000 UNITS CAPS Take by mouth.     Multiple Vitamin (MULTIVITAMIN) tablet Take 1 tablet by mouth daily.     omeprazole (PRILOSEC) 40 MG capsule Take 40 mg by mouth daily.     VITAMIN K PO Take by mouth.     famotidine (PEPCID) 40 MG  tablet Take 40 mg by mouth once as needed.     No facility-administered medications prior to visit.   Final Medications at End of Visit    Current Meds  Medication Sig   Cholecalciferol (VITAMIN D) 2000 UNITS CAPS Take by mouth.   Multiple Vitamin (MULTIVITAMIN) tablet Take 1 tablet by mouth daily.   omeprazole (PRILOSEC) 40 MG capsule Take 40  mg by mouth daily.   rosuvastatin (CRESTOR) 10 MG tablet Take 1 tablet (10 mg total) by mouth daily.   VITAMIN K PO Take by mouth.   [DISCONTINUED] famotidine (PEPCID) 40 MG tablet Take 40 mg by mouth once as needed.   Radiology:  None   Cardiac Studies:  CORONARY CALCIUM SCORES 07/22/2021:  Left Main: 0  LAD: 324  LCx: 205  RCA: 149  Total Agatston Score: 678  MESA database percentile: 96   AORTA MEASUREMENTS:  Ascending Aorta: 30 mm  Descending Aorta: 24 mm   OTHER FINDINGS:  The heart size is within normal limits. No pericardial fluid is identified. Visualized segments of the thoracic aorta and central pulmonary arteries are normal in caliber. Visualized mediastinum and hilar regions demonstrate no lymphadenopathy or masses. Visualized lungs show no evidence of pulmonary edema, consolidation, pneumothorax, nodule or pleural fluid. Visualized upper abdomen and bony structures are unremarkable.   IMPRESSION: Coronary calcium score 678 is at the 96th percentile for the patient's age, sex and race.  PCV ECHOCARDIOGRAM COMPLETE 84/69/6295 Normal LV systolic function with visual EF 60-65%. Left ventricle cavity is normal in size. Normal left ventricular wall thickness with basal septal hypertrophy. Normal global wall motion. Normal diastolic filling pattern, normal LAP. Native mitral valve with mild prolapse of mitral valve leaflets.  Mild (Grade I) mitral regurgitation. Trivial pericardial effusion. There is no hemodynamic significance. No prior study for comparison.   PCV MYOCARDIAL PERFUSION WO LEXISCAN 08/25/2021 Normal ECG  stress. The patient exercised for 5 minutes and 52 seconds of a Bruce protocol, achieving approximately 7.05 METs. The blood pressure response was normal. The heart rate response was accelerated suggests reduced aerobic tolerance. Myocardial perfusion is normal. Overall LV systolic function is normal without regional wall motion abnormalities. Stress LV EF: 73%. No previous exam available for comparison. Low risk.  EKG:   07/01/2021: Sinus rhythm at a rate of 84 bpm.  Normal axis.  Left atrial enlargement.  Voltage complexes, consider pulmonary disease pattern.  No evidence of ischemia or underlying injury pattern.  Assessment     ICD-10-CM   1. Elevated coronary artery calcium score  R93.1     2. Essential hypertension  I10     3. Mild mitral regurgitation  I34.0     4. Mitral valve prolapse  I34.1        Medications Discontinued During This Encounter  Medication Reason   famotidine (PEPCID) 40 MG tablet Change in therapy    Meds ordered this encounter  Medications   rosuvastatin (CRESTOR) 10 MG tablet    Sig: Take 1 tablet (10 mg total) by mouth daily.    Dispense:  30 tablet    Refill:  3     Recommendations:   Melanie Ward is a 66 y.o. Caucasian female with history of paroxysmal SVT, hyperlipidemia, GERD, prediabetes.  Patient also notes family history of CAD including father with MI at age 40.   Patient was evaluated in [facility] emergency department for atypical chest pain on 06/17/2021. Work-up in the emergency department at that time was unremarkable with nonischemic EKG EKG, normal chest x-ray, negative serial troponins, and negative D-dimer.  She then presented to establish care with our office 07/01/2021.  At that time ordered echocardiogram and coronary calcium score.  Echocardiogram revealed LVEF 60-65%, mild MVP with mild MR, no other significant abnormalities.  Coronary calcium scoring revealed total score of 678 placing patient in the 96 percentile, patient  therefore subsequently underwent nuclear  stress testing.  Stress test revealed normal myocardial perfusion and was overall low risk.  Patient now presents for follow-up for results of cardiac testing.  Patient has had no recurrence of chest pain since starting omeprazole, suspect previous atypical pain was GI related.  Reviewed and discussed results of coronary calcium score.  Given the patient is 96 percentile recommended initiation of statin therapy.  Discussed risks versus benefits, patient is hesitant, but willing to consider taking rosuvastatin low-dose 10 mg p.o. daily.  Also discussed risks versus benefits of low-dose aspirin daily, however given patient's GI history will hold off at this time.  Reviewed and discussed results of echocardiogram, details above.  Patient is otherwise stable from a cardiovascular standpoint.  Recommend aggressive risk factor modification.  Follow-up in 1 year, sooner if needed, for hyperlipidemia, hypertension, SVT, and elevated coronary calcium score.   Alethia Berthold, PA-C 08/27/2021, 10:58 AM Office: [phone]

## 2021-08-27 ENCOUNTER — Ambulatory Visit: Payer: BC Managed Care – PPO | Admitting: Student

## 2021-08-27 ENCOUNTER — Other Ambulatory Visit: Payer: Self-pay

## 2021-08-27 ENCOUNTER — Encounter: Payer: Self-pay | Admitting: Student

## 2021-08-27 VITALS — BP 120/75 | HR 77 | Temp 98.0°F | Ht 63.0 in | Wt 161.0 lb

## 2021-08-27 DIAGNOSIS — I341 Nonrheumatic mitral (valve) prolapse: Secondary | ICD-10-CM

## 2021-08-27 DIAGNOSIS — I340 Nonrheumatic mitral (valve) insufficiency: Secondary | ICD-10-CM

## 2021-08-27 DIAGNOSIS — R931 Abnormal findings on diagnostic imaging of heart and coronary circulation: Secondary | ICD-10-CM

## 2021-08-27 DIAGNOSIS — I10 Essential (primary) hypertension: Secondary | ICD-10-CM

## 2021-08-27 MED ORDER — ROSUVASTATIN CALCIUM 10 MG PO TABS: 10.0000 mg | ORAL_TABLET | Freq: Every day | ORAL | 3 refills | Status: DC

## 2021-11-05 ENCOUNTER — Ambulatory Visit: Payer: BC Managed Care – PPO | Admitting: Internal Medicine

## 2022-01-03 ENCOUNTER — Encounter: Payer: Self-pay | Admitting: Internal Medicine

## 2022-01-03 DIAGNOSIS — R5383 Other fatigue: Secondary | ICD-10-CM

## 2022-01-03 DIAGNOSIS — E785 Hyperlipidemia, unspecified: Secondary | ICD-10-CM

## 2022-01-03 DIAGNOSIS — R7301 Impaired fasting glucose: Secondary | ICD-10-CM

## 2022-01-07 ENCOUNTER — Other Ambulatory Visit (INDEPENDENT_AMBULATORY_CARE_PROVIDER_SITE_OTHER): Payer: BC Managed Care – PPO

## 2022-01-07 DIAGNOSIS — R5383 Other fatigue: Secondary | ICD-10-CM

## 2022-01-07 DIAGNOSIS — R7301 Impaired fasting glucose: Secondary | ICD-10-CM | POA: Diagnosis not present

## 2022-01-07 DIAGNOSIS — E785 Hyperlipidemia, unspecified: Secondary | ICD-10-CM | POA: Diagnosis not present

## 2022-01-07 LAB — LIPID PANEL
Cholesterol: 194 mg/dL (ref 0–200)
HDL: 65.4 mg/dL (ref >39.00)
LDL Cholesterol: 108 mg/dL — ABNORMAL HIGH (ref 0–99)
NonHDL: 128.3
Total CHOL/HDL Ratio: 3
Triglycerides: 101 mg/dL (ref 0.0–149.0)
VLDL: 20.2 mg/dL (ref 0.0–40.0)

## 2022-01-07 LAB — COMPREHENSIVE METABOLIC PANEL
ALT: 16 U/L (ref 0–35)
AST: 21 U/L (ref 0–37)
Albumin: 4.4 g/dL (ref 3.5–5.2)
Alkaline Phosphatase: 92 U/L (ref 39–117)
BUN: 19 mg/dL (ref 6–23)
CO2: 30 mEq/L (ref 19–32)
Calcium: 9.7 mg/dL (ref 8.4–10.5)
Chloride: 106 mEq/L (ref 96–112)
Creatinine, Ser: 0.7 mg/dL (ref 0.40–1.20)
GFR: 89.43 mL/min (ref >60.00)
Glucose, Bld: 89 mg/dL (ref 70–99)
Potassium: 4.5 mEq/L (ref 3.5–5.1)
Sodium: 142 mEq/L (ref 135–145)
Total Bilirubin: 0.6 mg/dL (ref 0.2–1.2)
Total Protein: 6.3 g/dL (ref 6.0–8.3)

## 2022-01-07 LAB — CBC WITH DIFFERENTIAL/PLATELET
Basophils Absolute: 0 10*3/uL (ref 0.0–0.1)
Basophils Relative: 1.1 % (ref 0.0–3.0)
Eosinophils Absolute: 0.1 10*3/uL (ref 0.0–0.7)
Eosinophils Relative: 2.5 % (ref 0.0–5.0)
HCT: 38.2 % (ref 36.0–46.0)
Hemoglobin: 12.7 g/dL (ref 12.0–15.0)
Lymphocytes Relative: 50.1 % — ABNORMAL HIGH (ref 12.0–46.0)
Lymphs Abs: 1.9 10*3/uL (ref 0.7–4.0)
MCHC: 33.2 g/dL (ref 30.0–36.0)
MCV: 95.1 fl (ref 78.0–100.0)
Monocytes Absolute: 0.4 10*3/uL (ref 0.1–1.0)
Monocytes Relative: 9.3 % (ref 3.0–12.0)
Neutro Abs: 1.4 10*3/uL (ref 1.4–7.7)
Neutrophils Relative %: 37 % — ABNORMAL LOW (ref 43.0–77.0)
Platelets: 199 10*3/uL (ref 150.0–400.0)
RBC: 4.02 Mil/uL (ref 3.87–5.11)
RDW: 12.9 % (ref 11.5–15.5)
WBC: 3.8 10*3/uL — ABNORMAL LOW (ref 4.0–10.5)

## 2022-01-07 LAB — HEMOGLOBIN A1C: Hgb A1c MFr Bld: 5.8 % (ref 4.6–6.5)

## 2022-01-07 LAB — TSH: TSH: 1.8 u[IU]/mL (ref 0.35–5.50)

## 2022-01-20 ENCOUNTER — Encounter: Payer: Self-pay | Admitting: Internal Medicine

## 2022-01-21 ENCOUNTER — Encounter: Payer: Self-pay | Admitting: Internal Medicine

## 2022-01-21 ENCOUNTER — Ambulatory Visit (INDEPENDENT_AMBULATORY_CARE_PROVIDER_SITE_OTHER): Payer: BC Managed Care – PPO | Admitting: Internal Medicine

## 2022-01-21 VITALS — BP 128/70 | HR 89 | Temp 98.0°F | Ht 63.0 in | Wt 155.4 lb

## 2022-01-21 DIAGNOSIS — E785 Hyperlipidemia, unspecified: Secondary | ICD-10-CM | POA: Diagnosis not present

## 2022-01-21 DIAGNOSIS — E663 Overweight: Secondary | ICD-10-CM | POA: Diagnosis not present

## 2022-01-21 DIAGNOSIS — N644 Mastodynia: Secondary | ICD-10-CM | POA: Diagnosis not present

## 2022-01-21 DIAGNOSIS — Z0001 Encounter for general adult medical examination with abnormal findings: Secondary | ICD-10-CM | POA: Diagnosis not present

## 2022-01-21 DIAGNOSIS — Z0000 Encounter for general adult medical examination without abnormal findings: Secondary | ICD-10-CM

## 2022-01-21 DIAGNOSIS — Z1231 Encounter for screening mammogram for malignant neoplasm of breast: Secondary | ICD-10-CM | POA: Diagnosis not present

## 2022-01-21 MED ORDER — TETANUS-DIPHTH-ACELL PERTUSSIS 5-2.5-18.5 LF-MCG/0.5 IM SUSY: 0.5000 mL | PREFILLED_SYRINGE | Freq: Once | INTRAMUSCULAR | 0 refills | Status: AC

## 2022-01-21 MED ORDER — ZOSTER VAC RECOMB ADJUVANTED 50 MCG/0.5ML IM SUSR: 0.5000 mL | Freq: Once | INTRAMUSCULAR | 1 refills | Status: AC

## 2022-01-21 NOTE — Assessment & Plan Note (Signed)
Weight loss of ten lbs since last year noted.  I have congratulated her in reduction of   BMI and encouraged  Continued weight loss  using a low glycemic index diet and regular exercise a minimum of 5 days per week.

## 2022-01-21 NOTE — Assessment & Plan Note (Signed)
Current risk using the ASCVD calculator is 0%  Lab Results  Component Value Date   CHOL 194 01/07/2022   HDL 65.40 01/07/2022   LDLCALC 108 (H) 01/07/2022   LDLDIRECT 135.6 06/20/2013   TRIG 101.0 01/07/2022   CHOLHDL 3 01/07/2022

## 2022-01-21 NOTE — Assessment & Plan Note (Signed)

## 2022-01-21 NOTE — Patient Instructions (Addendum)
You are due for the following vaccines, which you can get at your pharmacy:    TdAP  (tetanus diphtheria acellular pertussis)  Shingrix (Shingles vaccine)   Prevnar 20 (Pneumonia    Your cholesterol has improved on the plant based diet.  No medications are needed   I would supplement your diet with 600 mg calcium daily  Diagnostic mammogram and ultrasound have been ordered

## 2022-01-23 DIAGNOSIS — N644 Mastodynia: Secondary | ICD-10-CM | POA: Insufficient documentation

## 2022-01-23 NOTE — Progress Notes (Signed)
Patient ID: Melanie Ward, female    DOB: 02-08-1956  Age: 66 y.o. MRN: [id number]  The patient is here for annual preventive examination and management of other chronic and acute problems.   The risk factors are reflected in the social history.  The roster of all physicians providing medical care to patient - is listed in the Snapshot section of the chart.  Activities of daily living:  The patient is 100% independent in all ADLs: dressing, toileting, feeding as well as independent mobility  Home safety : The patient has smoke detectors in the home. They wear seatbelts.  There are no firearms at home. There is no violence in the home.   There is no risks for hepatitis, STDs or HIV. There is no   history of blood transfusion. They have no travel history to infectious disease endemic areas of the world.  The patient has seen their dentist in the last six month. They have seen their eye doctor in the last year. They admit to slight hearing difficulty with regard to whispered voices and some television programs.  They have deferred audiologic testing in the last year.  They do not  have excessive sun exposure. Discussed the need for sun protection: hats, long sleeves and use of sunscreen if there is significant sun exposure.   Diet: the importance of a healthy diet is discussed. They do have a healthy diet.  The benefits of regular aerobic exercise were discussed. She walks 4 times per week ,  20 minutes.   Depression screen: there are no signs or vegative symptoms of depression- irritability, change in appetite, anhedonia, sadness/tearfullness.  Cognitive assessment: the patient manages all their financial and personal affairs and is actively engaged. They could relate day,date,year and events; recalled 2/3 objects at 3 minutes; performed clock-face test normally.  The following portions of the patient's history were reviewed and updated as appropriate: allergies, current medications, past family  history, past medical history,  past surgical history, past social history  and problem list.  Visual acuity was not assessed per patient preference since she has regular follow up with her ophthalmologist. Hearing and body mass index were assessed and reviewed.   During the course of the visit the patient was educated and counseled about appropriate screening and preventive services including : fall prevention , diabetes screening, nutrition counseling, colorectal cancer screening, and recommended immunizations.    CC: The primary encounter diagnosis was Encounter for screening mammogram for malignant neoplasm of breast. Diagnoses of Overweight (BMI 25.0-29.9), Encounter for preventive health examination, Hyperlipidemia LDL goal <100, and Breast pain, right were also pertinent to this visit.  1) she reports persistent  right lateral breast pain for several weeks.  No breast changes , no trauma or bruises.  No masses on self exam.   History Melanie Ward has a past medical history of Cardiac arrhythmia, Endometriosis (1992), GERD (gastroesophageal reflux disease), Hearing loss due to cerumen impaction, right (05/30/2017), History of chicken pox, and PONV (postoperative nausea and vomiting).   She has a past surgical history that includes Tonsillectomy and adenoidectomy (1964); Laparoscopic endometriosis fulguration (1992); Appendectomy (1973); and Brow lift (Bilateral, 10/30/2020).   Her family history includes Cancer in her mother; Heart attack (age of onset: 46) in her maternal grandmother; Heart disease (age of onset: 60) in her father; Hypertension in her mother; Stroke in her maternal grandfather.She reports that she has never smoked. She has never used smokeless tobacco. She reports that she does not drink alcohol and does  not use drugs.  Outpatient Medications Prior to Visit  Medication Sig Dispense Refill   Cholecalciferol (VITAMIN D) 2000 UNITS CAPS Take by mouth.     Multiple Vitamin  (MULTIVITAMIN) tablet Take 1 tablet by mouth daily.     VITAMIN K PO Take by mouth.     omeprazole (PRILOSEC) 40 MG capsule Take 40 mg by mouth daily. (Patient not taking: Reported on 01/20/2022)     rosuvastatin (CRESTOR) 10 MG tablet Take 1 tablet (10 mg total) by mouth daily. 30 tablet 3   No facility-administered medications prior to visit.    Review of Systems  Patient denies headache, fevers, malaise, unintentional weight loss, skin rash, eye pain, sinus congestion and sinus pain, sore throat, dysphagia,  hemoptysis , cough, dyspnea, wheezing, chest pain, palpitations, orthopnea, edema, abdominal pain, nausea, melena, diarrhea, constipation, flank pain, dysuria, hematuria, urinary  Frequency, nocturia, numbness, tingling, seizures,  Focal weakness, Loss of consciousness,  Tremor, insomnia, depression, anxiety, and suicidal ideation.     Objective:  BP 128/70 (BP Location: Left Arm, Patient Position: Sitting, Cuff Size: Normal)   Pulse 89   Temp 98 F (36.7 C) (Oral)   Ht '5\' 3"'$  (1.6 m)   Wt 155 lb 6.4 oz (70.5 kg)   SpO2 98%   BMI 27.53 kg/m   Physical Exam  General appearance: alert, cooperative and appears stated age Head: Normocephalic, without obvious abnormality, atraumatic Eyes: conjunctivae/corneas clear. PERRL, EOM's intact. Fundi benign. Ears: normal TM's and external ear canals both ears Nose: Nares normal. Septum midline. Mucosa normal. No drainage or sinus tenderness. Throat: lips, mucosa, and tongue normal; teeth and gums normal Neck: no adenopathy, no carotid bruit, no JVD, supple, symmetrical, trachea midline and thyroid not enlarged, symmetric, no tenderness/mass/nodules Lungs: clear to auscultation bilaterally Breasts: normal appearance, no masses or tenderness Heart: regular rate and rhythm, S1, S2 normal, no murmur, click, rub or gallop Abdomen: soft, non-tender; bowel sounds normal; no masses,  no organomegaly Extremities: extremities normal, atraumatic, no  cyanosis or edema Pulses: 2+ and symmetric Skin: Skin color, texture, turgor normal. No rashes or lesions Neurologic: Alert and oriented X 3, normal strength and tone. Normal symmetric reflexes. Normal coordination and gait.     Assessment & Plan:   Problem List Items Addressed This Visit     Overweight (BMI 25.0-29.9)    Weight loss of ten lbs since last year noted.  I have congratulated her in reduction of   BMI and encouraged  Continued weight loss  using a low glycemic index diet and regular exercise a minimum of 5 days per week.        Hyperlipidemia LDL goal <100    Current risk using the ASCVD calculator is 0%  Lab Results  Component Value Date   CHOL 194 01/07/2022   HDL 65.40 01/07/2022   LDLCALC 108 (H) 01/07/2022   LDLDIRECT 135.6 06/20/2013   TRIG 101.0 01/07/2022   CHOLHDL 3 01/07/2022         Encounter for preventive health examination    age appropriate education and counseling updated, referrals for preventative services and immunizations addressed, dietary and smoking counseling addressed, most recent labs reviewed.  I have personally reviewed and have noted:   1) the patient's medical and social history 2) The pt's use of alcohol, tobacco, and illicit drugs 3) The patient's current medications and supplements 4) Functional ability including ADL's, fall risk, home safety risk, hearing and visual impairment 5) Diet and physical activities 6) Evidence for  depression or mood disorder 7) The patient's height, weight, and BMI have been recorded in the chart 8) I have ordered and reviewed a 12 lead EKG and find that there are no acute changes and patient is in sinus rhythm.     I have made referrals, and provided counseling and education based on review of the above      Breast pain, right    No masses on exam.  Diagnostic mammogram ordered       Relevant Orders   MM DIAG BREAST TOMO BILATERAL   US [facility]   Other Visit Diagnoses      Encounter for screening mammogram for malignant neoplasm of breast    -  Primary       I have discontinued Melanie Ward's omeprazole and rosuvastatin. I am also having her start on Tdap and Zoster Vaccine Adjuvanted. Additionally, I am having her maintain her multivitamin, Vitamin D, and VITAMIN K PO.  Meds ordered this encounter  Medications   Tdap (BOOSTRIX) 5-2.5-18.5 LF-MCG/0.5 injection    Sig: Inject 0.5 mLs into the muscle once for 1 dose.    Dispense:  0.5 mL    Refill:  0   Zoster Vaccine Adjuvanted [facility]) injection    Sig: Inject 0.5 mLs into the muscle once for 1 dose.    Dispense:  1 each    Refill:  1    Medications Discontinued During This Encounter  Medication Reason   omeprazole (PRILOSEC) 40 MG capsule    rosuvastatin (CRESTOR) 10 MG tablet     Follow-up: Return in about 1 year (around 01/22/2023).   Crecencio Mc, MD

## 2022-01-23 NOTE — Assessment & Plan Note (Signed)
No masses on exam.  Diagnostic mammogram ordered

## 2022-03-14 LAB — HM MAMMOGRAPHY

## 2022-03-16 ENCOUNTER — Encounter: Payer: Self-pay | Admitting: Internal Medicine

## 2022-04-25 LAB — HM DIABETES EYE EXAM

## 2022-07-27 NOTE — Telephone Encounter (Signed)
MyChart messgae sent to patient. 

## 2022-08-29 ENCOUNTER — Ambulatory Visit: Payer: BC Managed Care – PPO | Admitting: Student

## 2023-01-03 ENCOUNTER — Encounter: Payer: Self-pay | Admitting: Cardiology

## 2023-01-03 ENCOUNTER — Ambulatory Visit: Payer: PPO | Admitting: Cardiology

## 2023-01-03 VITALS — BP 144/75 | HR 102 | Ht 63.0 in | Wt 160.0 lb

## 2023-01-03 DIAGNOSIS — R030 Elevated blood-pressure reading, without diagnosis of hypertension: Secondary | ICD-10-CM | POA: Diagnosis not present

## 2023-01-03 DIAGNOSIS — E7800 Pure hypercholesterolemia, unspecified: Secondary | ICD-10-CM | POA: Diagnosis not present

## 2023-01-03 DIAGNOSIS — R931 Abnormal findings on diagnostic imaging of heart and coronary circulation: Secondary | ICD-10-CM | POA: Diagnosis not present

## 2023-01-03 NOTE — Progress Notes (Signed)
Primary Physician/Referring:  Sherlene Shams, MD  Patient ID: Melanie Ward, female    DOB: 24-Jun-1956, 67 y.o.   MRN: [id number]  Chief Complaint  Patient presents with   Hypertension   SVT   Elevated calcium score   Follow-up    1 year   HPI:    Melanie Ward  is a 67 y.o. Caucasian female with history of remote PSVT, hyperlipidemia, GERD, elevated coronary calcium score in the 96 percentile on 07/22/2021, family history of CAD including father with MI at age 50 presents for annual visit and remains asymptomatic.  Past Medical History:  Diagnosis Date   Cardiac arrhythmia    pt reports under a lot of stress at the time of diagnosis, unsure of type of dysrhythmia, has not experienced since   Endometriosis 1992   GERD (gastroesophageal reflux disease)    Hearing loss due to cerumen impaction, right 05/30/2017   irrigatio nneeded    History of chicken pox    PONV (postoperative nausea and vomiting)    after one of her endemotriosis surgeries   Past Surgical History:  Procedure Laterality Date   APPENDECTOMY  1973   BROW LIFT Bilateral 10/30/2020   Procedure: BLEPHAROPLASTY UPPER EYELID; W/EXCESS SKIN BILATERAL DIABETIC;  Surgeon: Imagene Riches, MD;  Location: [facility];  Service: Ophthalmology;  Laterality: Bilateral;   LAPAROSCOPIC ENDOMETRIOSIS FULGURATION  1992   endometriosis surgery x 2 in 1992   TONSILLECTOMY AND ADENOIDECTOMY  1964   Family History  Problem Relation Age of Onset   Cancer Mother        breast, died at 32   Hypertension Mother    Heart disease Father 22   Heart attack Maternal Grandmother 30   Stroke Maternal Grandfather     Social History   Tobacco Use   Smoking status: Never   Smokeless tobacco: Never  Substance Use Topics   Alcohol use: No   Marital Status: Married   ROS  Review of Systems  Cardiovascular:  Negative for chest pain, dyspnea on exertion and leg swelling.    Objective  Blood pressure (!) 144/75, pulse (!)  102, height 5\' 3"  (1.6 m), weight 160 lb (72.6 kg), SpO2 98 %.     01/03/2023    2:42 PM 01/20/2022    3:37 PM 08/27/2021   10:29 AM  Vitals with BMI  Height 5\' 3"  5\' 3"  5\' 3"   Weight 160 lbs 155 lbs 6 oz 161 lbs  BMI 28.35 27.53 28.53  Systolic 144 128 478  Diastolic 75 70 75  Pulse 102 89 77      Physical Exam Neck:     Vascular: No carotid bruit or JVD.  Cardiovascular:     Rate and Rhythm: Normal rate and regular rhythm.     Pulses: Intact distal pulses.     Heart sounds: Normal heart sounds. No murmur heard.    No gallop.  Pulmonary:     Effort: Pulmonary effort is normal.     Breath sounds: Normal breath sounds.  Abdominal:     General: Bowel sounds are normal.     Palpations: Abdomen is soft.  Musculoskeletal:     Right lower leg: No edema.     Left lower leg: No edema.    Laboratory examination:   Recent Labs    01/07/22 0831  NA 142  K 4.5  CL 106  CO2 30  GLUCOSE 89  BUN 19  CREATININE 0.70  CALCIUM  9.7   CrCl cannot be calculated (Patient's most recent lab result is older than the maximum 21 days allowed.).     Latest Ref Rng & Units 01/07/2022    8:31 AM 08/03/2021   10:46 AM 06/17/2021    9:03 AM  CMP  Glucose 70 - 99 mg/dL 89  161  096   BUN 6 - 23 mg/dL 19  10  9    Creatinine 0.40 - 1.20 mg/dL 0.45  4.09  8.11   Sodium 135 - 145 mEq/L 142  138  139   Potassium 3.5 - 5.1 mEq/L 4.5  4.1  4.0   Chloride 96 - 112 mEq/L 106  104  106   CO2 19 - 32 mEq/L 30  29  27    Calcium 8.4 - 10.5 mg/dL 9.7  9.6  9.4   Total Protein 6.0 - 8.3 g/dL 6.3  6.6  6.4   Total Bilirubin 0.2 - 1.2 mg/dL 0.6  0.5  0.9   Alkaline Phos 39 - 117 U/L 92  94  88   AST 0 - 37 U/L 21  23  27    ALT 0 - 35 U/L 16  18  22        Latest Ref Rng & Units 01/07/2022    8:31 AM 06/17/2021    9:03 AM 07/02/2019    1:48 PM  CBC  WBC 4.0 - 10.5 K/uL 3.8  4.5  5.4   Hemoglobin 12.0 - 15.0 g/dL 91.4  78.2  95.6   Hematocrit 36.0 - 46.0 % 38.2  38.4  37.4   Platelets 150.0 - 400.0  K/uL 199.0  219  206     Lipid Panel Recent Labs    01/07/22 0831  CHOL 194  TRIG 101.0  LDLCALC 108*  VLDL 20.2  HDL 65.40  CHOLHDL 3    HEMOGLOBIN A1C Lab Results  Component Value Date   HGBA1C 5.8 01/07/2022   TSH Recent Labs    01/07/22 0831  TSH 1.80   Radiology:  None   Cardiac Studies:  CORONARY CALCIUM SCORES 07/22/2021:  Left Main: 0  LAD: 324  LCx: 205  RCA: 149  Total Agatston Score: 678.  MESA database percentile: 96   AORTA MEASUREMENTS:  Ascending Aorta: 30 mm  Descending Aorta: 24 mm   OTHER FINDINGS:  The heart size is within normal limits. No pericardial fluid is identified. Visualized segments of the thoracic aorta and central pulmonary arteries are normal in caliber. Visualized mediastinum and hilar regions demonstrate no lymphadenopathy or masses. Visualized lungs show no evidence of pulmonary edema, consolidation, pneumothorax, nodule or pleural fluid. Visualized upper abdomen and bony structures are unremarkable.   IMPRESSION: Coronary calcium score 678 is at the 96th percentile for the patient's age, sex and race.  PCV ECHOCARDIOGRAM COMPLETE 07/07/2021 Normal LV systolic function with visual EF 60-65%. Left ventricle cavity is normal in size. Normal left ventricular wall thickness with basal septal hypertrophy. Normal global wall motion. Normal diastolic filling pattern, normal LAP. Native mitral valve with mild prolapse of mitral valve leaflets.  Mild (Grade I) mitral regurgitation. Trivial pericardial effusion. There is no hemodynamic significance. No prior study for comparison.   PCV MYOCARDIAL PERFUSION WO LEXISCAN 08/25/2021 Normal ECG stress. The patient exercised for 5 minutes and 52 seconds of a Bruce protocol, achieving approximately 7.05 METs. The blood pressure response was normal. The heart rate response was accelerated suggests reduced aerobic tolerance. Myocardial perfusion is normal. Overall LV systolic function is  normal without regional wall motion abnormalities. Stress LV EF: 73%. No previous exam available for comparison. Low risk.  EKG:   EKG 01/03/2023: Normal sinus rhythm with rate of 93 bpm, LAD, normal axis, no evidence of ischemia, normal EKG.  No change from 07/01/2021.   Allergies   Allergies  Allergen Reactions   Zithromax [Azithromycin] Rash    Current Outpatient Medications:    Cholecalciferol (VITAMIN D) 2000 UNITS CAPS, Take by mouth., Disp: , Rfl:    Multiple Vitamin (MULTIVITAMIN) tablet, Take 1 tablet by mouth daily., Disp: , Rfl:    VITAMIN K PO, Take by mouth., Disp: , Rfl:    Assessment     ICD-10-CM   1. Elevated coronary artery calcium score 07/22/2021:  Total Agatston Score: 678.  MESA database percentile: 96  R93.1 EKG 12-Lead    2. Elevated BP without diagnosis of hypertension  R03.0     3. Hypercholesteremia  E78.00        There are no discontinued medications.   No orders of the defined types were placed in this encounter.    Recommendations:   CHAD SALVESEN is a 67 y.o. Caucasian female with history of remote PSVT, hyperlipidemia, GERD, elevated coronary calcium score in the 96 percentile on 07/22/2021, family history of CAD including father with MI at age 11 presents for annual visit and remains asymptomatic.   1. Elevated coronary artery calcium score 07/22/2021:  Total Agatston Score: 678.  MESA database percentile: 69 I discussed with the patient regarding elevated coronary calcium score and high risk for cardiovascular events.  Advised the patient that she will at least need a low-dose of a statin of high intensity like atorvastatin 10 or Crestor 10 mg daily along with continued aspirin 81 mg daily.  Patient is very reluctant to starting any medications. She will think about this and let me know. - EKG 12-Lead  2. Elevated BP without diagnosis of hypertension Blood pressure was elevated today, I rechecked the blood pressure, still was elevated.   Suspect she probably may have mild hypertension, advised her that in view of underlying coronary artery disease, hypercholesterolemia, low threshold to start her on an ACE inhibitor or an ARB.  She has a follow-up appointment with Dr. Duncan Dull for complete physical next month.  3. Hypercholesteremia As discussed above, best option is primary prevention.  I also discussed with her regarding nonstatin therapy including bempedoic acid.  Information regarding statin therapy versus bempedoic acid discussed with the patient, she will make a decision what she would like to choose.  Otherwise she remains stable from cardiac standpoint without recurrence of any chest pain, remains asymptomatic, continues to exercise regularly by walking for at least 1 to 2 miles a day.  I will see her back in a year or sooner if problems.  If she remains stable I will see her back on a as needed basis.   Yates Decamp, MD, [facility] 01/03/2023, 5:35 PM Office: [phone] Fax: [phone] Pager: [phone]

## 2023-01-30 ENCOUNTER — Encounter: Payer: Self-pay | Admitting: Internal Medicine

## 2023-01-30 ENCOUNTER — Ambulatory Visit (INDEPENDENT_AMBULATORY_CARE_PROVIDER_SITE_OTHER): Payer: PPO | Admitting: Internal Medicine

## 2023-01-30 DIAGNOSIS — Z0000 Encounter for general adult medical examination without abnormal findings: Secondary | ICD-10-CM | POA: Diagnosis not present

## 2023-01-30 DIAGNOSIS — E663 Overweight: Secondary | ICD-10-CM

## 2023-01-30 DIAGNOSIS — Z780 Asymptomatic menopausal state: Secondary | ICD-10-CM

## 2023-01-30 DIAGNOSIS — Z1231 Encounter for screening mammogram for malignant neoplasm of breast: Secondary | ICD-10-CM

## 2023-01-30 DIAGNOSIS — I2584 Coronary atherosclerosis due to calcified coronary lesion: Secondary | ICD-10-CM | POA: Diagnosis not present

## 2023-01-30 DIAGNOSIS — E785 Hyperlipidemia, unspecified: Secondary | ICD-10-CM

## 2023-01-30 DIAGNOSIS — R7303 Prediabetes: Secondary | ICD-10-CM

## 2023-01-30 LAB — COMPREHENSIVE METABOLIC PANEL
ALT: 15 U/L (ref 0–35)
AST: 21 U/L (ref 0–37)
Albumin: 4.3 g/dL (ref 3.5–5.2)
Alkaline Phosphatase: 94 U/L (ref 39–117)
BUN: 10 mg/dL (ref 6–23)
CO2: 28 mEq/L (ref 19–32)
Calcium: 9.5 mg/dL (ref 8.4–10.5)
Chloride: 107 mEq/L (ref 96–112)
Creatinine, Ser: 0.68 mg/dL (ref 0.40–1.20)
GFR: 89.39 mL/min (ref >60.00)
Glucose, Bld: 95 mg/dL (ref 70–99)
Potassium: 4.5 mEq/L (ref 3.5–5.1)
Sodium: 140 mEq/L (ref 135–145)
Total Bilirubin: 0.5 mg/dL (ref 0.2–1.2)
Total Protein: 7 g/dL (ref 6.0–8.3)

## 2023-01-30 LAB — LIPID PANEL
Cholesterol: 198 mg/dL (ref 0–200)
HDL: 59.7 mg/dL (ref >39.00)
LDL Cholesterol: 112 mg/dL — ABNORMAL HIGH (ref 0–99)
NonHDL: 138.69
Total CHOL/HDL Ratio: 3
Triglycerides: 135 mg/dL (ref 0.0–149.0)
VLDL: 27 mg/dL (ref 0.0–40.0)

## 2023-01-30 LAB — TSH: TSH: 1.86 u[IU]/mL (ref 0.35–5.50)

## 2023-01-30 LAB — LDL CHOLESTEROL, DIRECT: Direct LDL: 115 mg/dL

## 2023-01-30 LAB — HEMOGLOBIN A1C: Hgb A1c MFr Bld: 5.8 % (ref 4.6–6.5)

## 2023-01-30 NOTE — Progress Notes (Signed)
Patient ID: Melanie Ward, female    DOB: 1956-08-08  Age: 67 y.o. MRN: [id number]  The patient is here for annual preventive examination and management of other chronic and acute problems.   The risk factors are reflected in the social history.  The roster of all physicians providing medical care to patient - is listed in the Snapshot section of the chart.  Activities of daily living:  The patient is 100% independent in all ADLs: dressing, toileting, feeding as well as independent mobility  Home safety : The patient has smoke detectors in the home. They wear seatbelts.  There are no firearms at home. There is no violence in the home.   There is no risks for hepatitis, STDs or HIV. There is no   history of blood transfusion. They have no travel history to infectious disease endemic areas of the world.  The patient has seen their dentist in the last six month. They have seen their eye doctor in the last year. They admit to slight hearing difficulty with regard to whispered voices and some television programs.  They have deferred audiologic testing in the last year.  They do not  have excessive sun exposure. Discussed the need for sun protection: hats, long sleeves and use of sunscreen if there is significant sun exposure.   Diet: the importance of a healthy diet is discussed. They do have a healthy diet.  The benefits of regular aerobic exercise were discussed. She walks 4 times per week ,  20 minutes.   Depression screen: there are no signs or vegative symptoms of depression- irritability, change in appetite, anhedonia, sadness/tearfullness.  Cognitive assessment: the patient manages all their financial and personal affairs and is actively engaged. They could relate day,date,year and events; recalled 2/3 objects at 3 minutes; performed clock-face test normally.  The following portions of the patient's history were reviewed and updated as appropriate: allergies, current medications, past family  history, past medical history,  past surgical history, past social history  and problem list.  Visual acuity was not assessed per patient preference since she has regular follow up with her ophthalmologist. Hearing and body mass index were assessed and reviewed.   During the course of the visit the patient was educated and counseled about appropriate screening and preventive services including : fall prevention , diabetes screening, nutrition counseling, colorectal cancer screening, and recommended immunizations.    CC: The primary encounter diagnosis was Postmenopausal estrogen deficiency. Diagnoses of Encounter for screening mammogram for malignant neoplasm of breast, Overweight (BMI 25.0-29.9), Hyperlipidemia LDL goal <100, Prediabetes, Coronary atherosclerosis due to calcified coronary lesion (CODE), and Health maintenance examination were also pertinent to this visit.   None  History Melanie Ward has a past medical history of Cardiac arrhythmia, Endometriosis (1992), GERD (gastroesophageal reflux disease), Hearing loss due to cerumen impaction, right (05/30/2017), History of chicken pox, and PONV (postoperative nausea and vomiting).   She has a past surgical history that includes Tonsillectomy and adenoidectomy (1964); Laparoscopic endometriosis fulguration (1992); Appendectomy (1973); and Brow lift (Bilateral, 10/30/2020).   Her family history includes Cancer in her mother; Heart attack (age of onset: 58) in her maternal grandmother; Heart disease (age of onset: 9) in her father; Hypertension in her mother; Stroke in her maternal grandfather.She reports that she has never smoked. She has never used smokeless tobacco. She reports that she does not drink alcohol and does not use drugs.  Outpatient Medications Prior to Visit  Medication Sig Dispense Refill   Cholecalciferol (VITAMIN D)  2000 UNITS CAPS Take by mouth.     GARLIC PO Take 1 tablet by mouth daily.     Magnesium 400 MG CAPS Take 1 capsule  by mouth daily.     Multiple Vitamin (MULTIVITAMIN) tablet Take 1 tablet by mouth daily.     triamcinolone cream (KENALOG) 0.1 % Apply 1 Application topically 2 (two) times daily.     VITAMIN K PO Take by mouth. (Patient not taking: Reported on 01/30/2023)     No facility-administered medications prior to visit.    Review of Systems  Patient denies headache, fevers, malaise, unintentional weight loss, skin rash, eye pain, sinus congestion and sinus pain, sore throat, dysphagia,  hemoptysis , cough, dyspnea, wheezing, chest pain, palpitations, orthopnea, edema, abdominal pain, nausea, melena, diarrhea, constipation, flank pain, dysuria, hematuria, urinary  Frequency, nocturia, numbness, tingling, seizures,  Focal weakness, Loss of consciousness,  Tremor, insomnia, depression, anxiety, and suicidal ideation.    Objective:  There were no vitals taken for this visit.  Physical Exam Vitals reviewed.  Constitutional:      General: She is not in acute distress.    Appearance: Normal appearance. She is well-developed and normal weight. She is not ill-appearing, toxic-appearing or diaphoretic.  HENT:     Head: Normocephalic.     Right Ear: Tympanic membrane, ear canal and external ear normal. There is no impacted cerumen.     Left Ear: Tympanic membrane, ear canal and external ear normal. There is no impacted cerumen.     Nose: Nose normal.     Mouth/Throat:     Mouth: Mucous membranes are moist.     Pharynx: Oropharynx is clear.  Eyes:     General: No scleral icterus.       Right eye: No discharge.        Left eye: No discharge.     Conjunctiva/sclera: Conjunctivae normal.     Pupils: Pupils are equal, round, and reactive to light.  Neck:     Thyroid: No thyromegaly.     Vascular: No carotid bruit or JVD.  Cardiovascular:     Rate and Rhythm: Normal rate and regular rhythm.     Heart sounds: Normal heart sounds.  Pulmonary:     Effort: Pulmonary effort is normal. No respiratory  distress.     Breath sounds: Normal breath sounds.  Chest:  Breasts:    Breasts are symmetrical.     Right: Normal. No swelling, inverted nipple, mass, nipple discharge, skin change or tenderness.     Left: Normal. No swelling, inverted nipple, mass, nipple discharge, skin change or tenderness.  Abdominal:     General: Bowel sounds are normal.     Palpations: Abdomen is soft. There is no mass.     Tenderness: There is no abdominal tenderness. There is no guarding or rebound.  Musculoskeletal:        General: Normal range of motion.     Cervical back: Normal range of motion and neck supple.  Lymphadenopathy:     Cervical: No cervical adenopathy.     Upper Body:     Right upper body: No supraclavicular, axillary or pectoral adenopathy.     Left upper body: No supraclavicular, axillary or pectoral adenopathy.  Skin:    General: Skin is warm and dry.  Neurological:     General: No focal deficit present.     Mental Status: She is alert and oriented to person, place, and time. Mental status is at baseline.  Psychiatric:  Mood and Affect: Mood normal.        Behavior: Behavior normal.        Thought Content: Thought content normal.        Judgment: Judgment normal.       Assessment & Plan:  Postmenopausal estrogen deficiency -     DG Bone Density; Future  Encounter for screening mammogram for malignant neoplasm of breast -     3D Screening Mammogram, Left and Right; Future  Overweight (BMI 25.0-29.9) Assessment & Plan: I have  encouraged  Continued weight loss  using a low glycemic index diet and regular exercise a minimum of 5 days per week.     Hyperlipidemia LDL goal <100 Assessment & Plan: Current risk using the ASCVD calculator is 0%  Lab Results  Component Value Date   CHOL 198 01/30/2023   HDL 59.70 01/30/2023   LDLCALC 112 (H) 01/30/2023   LDLDIRECT 115.0 01/30/2023   TRIG 135.0 01/30/2023   CHOLHDL 3 01/30/2023     Orders: -     Lipid panel -      LDL cholesterol, direct -     Comprehensive metabolic panel -     TSH  Prediabetes -     Hemoglobin A1c  Coronary atherosclerosis due to calcified coronary lesion (CODE) -     Ambulatory referral to Cardiology  Health maintenance examination Assessment & Plan: age appropriate education and counseling updated, referrals for preventative services and immunizations addressed, dietary and smoking counseling addressed, most recent labs reviewed.  I have personally reviewed and have noted:   1) the patient's medical and social history 2) The pt's use of alcohol, tobacco, and illicit drugs 3) The patient's current medications and supplements 4) Functional ability including ADL's, fall risk, home safety risk, hearing and visual impairment 5) Diet and physical activities 6) Evidence for depression or mood disorder 7) The patient's height, weight, and BMI have been recorded in the chart   I have made referrals, and provided counseling and education based on review of the above        I provided 40 minutes of  face-to-face time during this encounter reviewing patient's current problems and past surgeries,  recent labs and imaging studies, providing counseling on the above mentioned problems , and coordination  of care .   Follow-up: No follow-ups on file.   Sherlene Shams, MD

## 2023-01-30 NOTE — Assessment & Plan Note (Signed)
Current risk using the ASCVD calculator is 0%  Lab Results  Component Value Date   CHOL 198 01/30/2023   HDL 59.70 01/30/2023   LDLCALC 112 (H) 01/30/2023   LDLDIRECT 115.0 01/30/2023   TRIG 135.0 01/30/2023   CHOLHDL 3 01/30/2023

## 2023-01-30 NOTE — Assessment & Plan Note (Signed)
I have  encouraged  Continued weight loss  using a low glycemic index diet and regular exercise a minimum of 5 days per week.

## 2023-01-30 NOTE — Assessment & Plan Note (Signed)

## 2023-01-30 NOTE — Patient Instructions (Addendum)
You are due for the following vaccinations;   The Tdap (tetanus-diphtheria-whooping cough vaccine ) and Shingrix vaccines are  due and now  COVERED BY MEDICARE if you get them at your pharmacy You can expect 24 hours of flu like symptoms after receiving the shingles vaccine, so plan accordingly.      Prevnar 20 (Pneumonia)    [employer] "low carb power bowl"  entrees and  "Steamer" entrees are are great low carb entrees that microwave in 5 minutes

## 2023-02-09 ENCOUNTER — Telehealth: Payer: Self-pay | Admitting: Internal Medicine

## 2023-02-09 NOTE — Telephone Encounter (Signed)
Lft pt vm to call ofc to verify mammo and bone dens location. thanks

## 2023-03-16 ENCOUNTER — Telehealth: Payer: Self-pay

## 2023-03-16 NOTE — Patient Outreach (Signed)
  Care Coordination   Initial Visit Note   03/16/2023 Name: Melanie Ward MRN: [id number] DOB: 04-Dec-1955  Melanie Ward is a 67 y.o. year old female who sees Darrick Huntsman, Mar Daring, MD for primary care. I spoke with  Geannie Risen by phone today.  What matters to the patients health and wellness today?  Patient denies need for care coordination services and/ or community resources need.     Goals Addressed             This Visit's Progress    COMPLETED: Care coordination activities - no follow up needed.       Interventions Today    Flowsheet Row Most Recent Value  General Interventions   General Interventions Discussed/Reviewed General Interventions Discussed  [Care coordination services discussed. SDOH survey completed. AWV discussed and patient advised to contact provider office to schedule. Discussed vaccines. Advised to contact PCP office if care coordination services needed in the future.]              SDOH assessments and interventions completed:  No     Care Coordination Interventions:  Yes, provided   Follow up plan: No further intervention required.   Encounter Outcome:  Pt. Visit Completed   George Ina RN,BSN,CCM [facility] Care Coordination [phone] direct line

## 2023-03-20 DIAGNOSIS — Z1231 Encounter for screening mammogram for malignant neoplasm of breast: Secondary | ICD-10-CM | POA: Diagnosis not present

## 2023-03-20 LAB — HM MAMMOGRAPHY

## 2023-03-21 ENCOUNTER — Encounter: Payer: Self-pay | Admitting: Internal Medicine

## 2023-03-31 ENCOUNTER — Ambulatory Visit: Payer: PPO | Admitting: Nurse Practitioner

## 2023-03-31 ENCOUNTER — Encounter: Payer: Self-pay | Admitting: Nurse Practitioner

## 2023-03-31 VITALS — BP 112/62 | HR 76 | Temp 97.9°F | Resp 16 | Ht 63.0 in | Wt 160.4 lb

## 2023-03-31 DIAGNOSIS — R350 Frequency of micturition: Secondary | ICD-10-CM | POA: Diagnosis not present

## 2023-03-31 DIAGNOSIS — R0781 Pleurodynia: Secondary | ICD-10-CM

## 2023-03-31 LAB — POC URINALSYSI DIPSTICK (AUTOMATED)
Bilirubin, UA: NEGATIVE
Blood, UA: NEGATIVE
Glucose, UA: NEGATIVE
Ketones, UA: NEGATIVE
Leukocytes, UA: NEGATIVE
Nitrite, UA: NEGATIVE
Protein, UA: NEGATIVE
Spec Grav, UA: 1.01 (ref 1.010–1.025)
Urobilinogen, UA: 0.2 E.U./dL
pH, UA: 7.5 (ref 5.0–8.0)

## 2023-03-31 NOTE — Progress Notes (Signed)
Established Patient Office Visit  Subjective:  Patient ID: Melanie Ward, female    DOB: 08-May-1956  Age: 67 y.o. MRN: [id number]  CC:  Chief Complaint  Patient presents with   Urinary Frequency    Smell X couple of weeks   Flank Pain    X Tuesday night    HPI  Melanie Ward presents for urinary frequency and odor from couple of weeks.  Patient states that she is waking up at night to use the bathroom recently.   Since Tuesday night she has noticed pain under the right rib. No pain with walking, some pain turning from side to side.  She did 3 miles of hiking on Monday and is unsure if she pulled some muscles during the hiking.  She has not used any medication for it.   She is eating and drinking okay. Bowels are normal.  Denise nausea, vomiting, diarrhea. HPI   Past Medical History:  Diagnosis Date   Cardiac arrhythmia    pt reports under a lot of stress at the time of diagnosis, unsure of type of dysrhythmia, has not experienced since   Endometriosis 1992   GERD (gastroesophageal reflux disease)    Hearing loss due to cerumen impaction, right 05/30/2017   irrigatio nneeded    History of chicken pox    PONV (postoperative nausea and vomiting)    after one of her endemotriosis surgeries    Past Surgical History:  Procedure Laterality Date   APPENDECTOMY  1973   BROW LIFT Bilateral 10/30/2020   Procedure: BLEPHAROPLASTY UPPER EYELID; W/EXCESS SKIN BILATERAL DIABETIC;  Surgeon: Imagene Riches, MD;  Location: [facility];  Service: Ophthalmology;  Laterality: Bilateral;   LAPAROSCOPIC ENDOMETRIOSIS FULGURATION  1992   endometriosis surgery x 2 in 1992   TONSILLECTOMY AND ADENOIDECTOMY  1964    Family History  Problem Relation Age of Onset   Cancer Mother        breast, died at 38   Hypertension Mother    Heart disease Father 51   Heart attack Maternal Grandmother 73   Stroke Maternal Grandfather     Social History   Socioeconomic History   Marital  status: Married    Spouse name: Not on file   Number of children: 2   Years of education: Not on file   Highest education level: Not on file  Occupational History   Not on file  Tobacco Use   Smoking status: Never   Smokeless tobacco: Never  Vaping Use   Vaping status: Never Used  Substance and Sexual Activity   Alcohol use: No   Drug use: No   Sexual activity: Yes    Birth control/protection: Post-menopausal  Other Topics Concern   Not on file  Social History Narrative   Not on file   Social Determinants of Health   Financial Resource Strain: Not on file  Food Insecurity: Not on file  Transportation Needs: Not on file  Physical Activity: Not on file  Stress: Not on file  Social Connections: Not on file  Intimate Partner Violence: Not on file     Outpatient Medications Prior to Visit  Medication Sig Dispense Refill   Cholecalciferol (VITAMIN D) 2000 UNITS CAPS Take by mouth.     GARLIC PO Take 1 tablet by mouth daily.     Magnesium 400 MG CAPS Take 1 capsule by mouth daily.     Multiple Vitamin (MULTIVITAMIN) tablet Take 1 tablet by mouth daily.  VITAMIN K PO Take by mouth.     triamcinolone cream (KENALOG) 0.1 % Apply 1 Application topically 2 (two) times daily. (Patient not taking: Reported on 03/31/2023)     No facility-administered medications prior to visit.    Allergies  Allergen Reactions   Zithromax [Azithromycin] Rash    ROS Review of Systems Negative unless indicated in HPI.    Objective:    Physical Exam Constitutional:      Appearance: Normal appearance.  Cardiovascular:     Rate and Rhythm: Normal rate and regular rhythm.     Pulses: Normal pulses.     Heart sounds: Normal heart sounds.  Pulmonary:     Effort: Pulmonary effort is normal.     Breath sounds: Normal breath sounds.  Abdominal:     General: Bowel sounds are normal.     Palpations: Abdomen is soft.     Tenderness: There is no abdominal tenderness. There is no right CVA  tenderness or left CVA tenderness.  Musculoskeletal:     Cervical back: Normal range of motion.     Right lower leg: No edema.     Left lower leg: No edema.  Neurological:     General: No focal deficit present.     Mental Status: She is alert. Mental status is at baseline.  Psychiatric:        Mood and Affect: Mood normal.        Behavior: Behavior normal.        Thought Content: Thought content normal.        Judgment: Judgment normal.     BP 112/62   Pulse 76   Temp 97.9 F (36.6 C)   Resp 16   Ht 5\' 3"  (1.6 m)   Wt 160 lb 6 oz (72.7 kg)   SpO2 99%   BMI 28.41 kg/m  Wt Readings from Last 3 Encounters:  03/31/23 160 lb 6 oz (72.7 kg)  01/03/23 160 lb (72.6 kg)  01/20/22 155 lb 6.4 oz (70.5 kg)     Health Maintenance  Topic Date Due   Medicare Annual Wellness (AWV)  Never done   Zoster Vaccines- Shingrix (1 of 2) Never done   Pneumonia Vaccine 25+ Years old (1 of 1 - PCV) Never done   DTaP/Tdap/Td (3 - Tdap) 05/12/2021   COVID-19 Vaccine (1 - 2023-24 season) Never done   DEXA SCAN  09/23/2022   INFLUENZA VACCINE  03/16/2023   MAMMOGRAM  03/19/2024   Colonoscopy  01/16/2029   Hepatitis C Screening  Completed   HPV VACCINES  Aged Out    There are no preventive care reminders to display for this patient.  Lab Results  Component Value Date   TSH 1.86 01/30/2023   Lab Results  Component Value Date   WBC 3.8 (L) 01/07/2022   HGB 12.7 01/07/2022   HCT 38.2 01/07/2022   MCV 95.1 01/07/2022   PLT 199.0 01/07/2022   Lab Results  Component Value Date   NA 140 01/30/2023   K 4.5 01/30/2023   CO2 28 01/30/2023   GLUCOSE 95 01/30/2023   BUN 10 01/30/2023   CREATININE 0.68 01/30/2023   BILITOT 0.5 01/30/2023   ALKPHOS 94 01/30/2023   AST 21 01/30/2023   ALT 15 01/30/2023   PROT 7.0 01/30/2023   ALBUMIN 4.3 01/30/2023   CALCIUM 9.5 01/30/2023   ANIONGAP 6 06/17/2021   GFR 89.39 01/30/2023   Lab Results  Component Value Date   CHOL 198 01/30/2023  Lab Results  Component Value Date   HDL 59.70 01/30/2023   Lab Results  Component Value Date   LDLCALC 112 (H) 01/30/2023   Lab Results  Component Value Date   TRIG 135.0 01/30/2023   Lab Results  Component Value Date   CHOLHDL 3 01/30/2023   Lab Results  Component Value Date   HGBA1C 5.8 01/30/2023      Assessment & Plan:  Urination frequency Assessment & Plan: POCT urinalysis negative. Urine culture pending. Advised patient to increase fluid intake. If symptoms not improving call the office.  Orders: -     POCT Urinalysis Dipstick (Automated) -     Urine Culture  Rib pain on right side Assessment & Plan: Advised patient to use warm compress and perform stretching exercises. If symptoms not improving would consider imaging.      Follow-up: No follow-ups on file.   Kara Dies, NP

## 2023-03-31 NOTE — Patient Instructions (Addendum)
Increase fluid and will let you know the culture result. Perform  warm compress over the pain area on the right rib.  Let us know if the pain is not getting better.

## 2023-04-09 ENCOUNTER — Telehealth: Payer: Self-pay | Admitting: Nurse Practitioner

## 2023-04-09 DIAGNOSIS — R0781 Pleurodynia: Secondary | ICD-10-CM | POA: Insufficient documentation

## 2023-04-09 NOTE — Assessment & Plan Note (Signed)
Advised patient to use warm compress and perform stretching exercises. If symptoms not improving would consider imaging.

## 2023-04-09 NOTE — Assessment & Plan Note (Signed)
POCT urinalysis negative. Urine culture pending. Advised patient to increase fluid intake. If symptoms not improving call the office.

## 2023-04-10 NOTE — Telephone Encounter (Signed)
Left message for pt to call back in regards to Kaur's message

## 2023-04-11 NOTE — Telephone Encounter (Signed)
Noted! Thank you

## 2023-04-25 ENCOUNTER — Telehealth: Payer: Self-pay | Admitting: Internal Medicine

## 2023-04-25 NOTE — Telephone Encounter (Signed)
Copied from CRM [id number]. Topic: Medicare AWV >> Apr 25, 2023  3:24 PM Payton Doughty wrote: Reason for CRM: LM 04/25/2023 to schedule AWV   Verlee Rossetti; Care Guide Ambulatory Clinical Support [place] [facility] Direct Dial: [phone]

## 2023-04-27 DIAGNOSIS — H40003 Preglaucoma, unspecified, bilateral: Secondary | ICD-10-CM | POA: Diagnosis not present

## 2023-05-02 DIAGNOSIS — E119 Type 2 diabetes mellitus without complications: Secondary | ICD-10-CM | POA: Diagnosis not present

## 2023-05-02 DIAGNOSIS — H40003 Preglaucoma, unspecified, bilateral: Secondary | ICD-10-CM | POA: Diagnosis not present

## 2023-05-02 LAB — HM DIABETES EYE EXAM

## 2023-06-08 ENCOUNTER — Ambulatory Visit: Payer: PPO

## 2023-06-25 ENCOUNTER — Encounter: Payer: Self-pay | Admitting: Internal Medicine

## 2023-06-26 ENCOUNTER — Encounter: Payer: Self-pay | Admitting: Podiatry

## 2023-06-26 ENCOUNTER — Ambulatory Visit: Payer: PPO | Admitting: Podiatry

## 2023-06-26 ENCOUNTER — Other Ambulatory Visit: Payer: Self-pay

## 2023-06-26 DIAGNOSIS — Z780 Asymptomatic menopausal state: Secondary | ICD-10-CM

## 2023-06-26 DIAGNOSIS — M722 Plantar fascial fibromatosis: Secondary | ICD-10-CM

## 2023-06-26 NOTE — Progress Notes (Signed)
  Subjective:  Patient ID: Melanie Ward, female    DOB: 1955/12/22,  MRN: [id number]  Chief Complaint  Patient presents with   Plantar Fasciitis    Left Foot heel pain for 3-4 months. Previously had plantar fasciitis as well same foot about 1 year ago which self resolved    67 y.o. female presents with the above complaint.  Patient presents for pain in the left heel.  Has been going on for 3 to 4 months.  Also notes calf tightness as well.  Previously had plantar fasciitis which self resolved.  She does have old over-the-counter inserts but want some new ones.   Review of Systems: Negative except as noted in the HPI. Denies N/V/F/Ch.   Objective:  There were no vitals filed for this visit. There is no height or weight on file to calculate BMI. Constitutional Well developed. Well nourished.  Vascular Dorsalis pedis pulses palpable bilaterally. Posterior tibial pulses palpable bilaterally. Capillary refill normal to all digits.  No cyanosis or clubbing noted. Pedal hair growth normal.  Neurologic Normal speech. Oriented to person, place, and time. Epicritic sensation to light touch grossly present bilaterally.  Dermatologic Nails well groomed and normal in appearance. No open wounds. No skin lesions.  Orthopedic: Normal joint ROM without pain or crepitus bilaterally. No visible deformities. Tender to palpation at the calcaneal tuber left. No pain with calcaneal squeeze left. Ankle ROM diminished range of motion left. Silfverskiold Test: positive left.    Assessment:   1. Plantar fasciitis, left    Plan:  Patient was evaluated and treated and all questions answered.  Plantar Fasciitis, left - XR reviewed as above.  - Educated on icing and stretching. Instructions given.  - Injection delivered to the plantar fascia as below. - DME: [employer] orthotics dispensed - Pharmacologic management: Meloxicam 15 mg daily for 30 days. Educated on risks/benefits and proper taking  of medication.  Procedure: Injection Tendon/Ligament Location: Left plantar fascia at the glabrous junction; medial approach. Skin Prep: alcohol Injectate: 1 cc 0.5% marcaine plain, 1 cc kenalog 10. Disposition: Patient tolerated procedure well. Injection site dressed with a band-aid.  Return in about 4 weeks (around 07/24/2023) for f/u L PF.

## 2023-06-26 NOTE — Patient Instructions (Signed)

## 2023-07-18 ENCOUNTER — Ambulatory Visit (INDEPENDENT_AMBULATORY_CARE_PROVIDER_SITE_OTHER): Payer: PPO | Admitting: Internal Medicine

## 2023-07-18 ENCOUNTER — Encounter: Payer: Self-pay | Admitting: Internal Medicine

## 2023-07-18 VITALS — BP 130/74 | HR 99 | Ht 63.0 in | Wt 161.0 lb

## 2023-07-18 DIAGNOSIS — E785 Hyperlipidemia, unspecified: Secondary | ICD-10-CM

## 2023-07-18 DIAGNOSIS — Z0000 Encounter for general adult medical examination without abnormal findings: Secondary | ICD-10-CM

## 2023-07-18 DIAGNOSIS — H9193 Unspecified hearing loss, bilateral: Secondary | ICD-10-CM | POA: Diagnosis not present

## 2023-07-18 DIAGNOSIS — H833X3 Noise effects on inner ear, bilateral: Secondary | ICD-10-CM

## 2023-07-18 DIAGNOSIS — M722 Plantar fascial fibromatosis: Secondary | ICD-10-CM

## 2023-07-18 NOTE — Patient Instructions (Signed)
Referral for hearing test and cardiology second opinion in progress  Let me know which NMR profile you would like done and I will order it

## 2023-07-18 NOTE — Progress Notes (Unsigned)
The patient is here for the Welcome to  Medicare  preventive visit     has a past medical history of Cardiac arrhythmia, Endometriosis (1992), GERD (gastroesophageal reflux disease), Hearing loss due to cerumen impaction, right (05/30/2017), History of chicken pox, and PONV (postoperative nausea and vomiting).    reports that she has never smoked. She has never used smokeless tobacco. She reports that she does not drink alcohol and does not use drugs.   The roster of all physicians providing medical care to patient : Patient Care Team: Sherlene Shams, MD as PCP - General (Internal Medicine)  Activities of daily living:  The patient is 100% independent in all ADLs: dressing, toileting, feeding as well as independent mobility Fall risk was assessed by direct patient evaluation of patient's balance, gait and ability to risk from a chair and from a kneeling position. Home safety : The patient has smoke detectors in the home. They wear seatbelts.  There are no unsecured firearms at home. There is no violence in the home.  Patient has seen their eye doctor in the last year.   Visual acuity was assessed today  and was 20/20 with correction lenses. Patient does note hearing difficulty with regard to whispered voices and some television programs and has  deferred audiologic testing in the last year.   There is no risks for hepatitis, STDs or HIV. There is no   history of blood transfusion. They have no travel history to infectious disease endemic areas of the world.  The patient has seen their dentist in the last six month.  They do not  have excessive sun exposure. Discussed the need for sun protection: hats, long sleeves and use of sunscreen if there is significant sun exposure.   Diet: the importance of a healthy diet is discussed. They do have a healthy diet. She switched from plant based to some animal protein  due to wt loss of 14 lbs resulting In  hair  loss   The benefits of regular aerobic  exercise were discussed. Patient walks 4 times per week ,   but lately has been Limited by plantar fasciitis being treated by podiatrist on [place] with one injection thus far   Depression screen:      07/18/2023    2:23 PM 03/31/2023    2:42 PM 01/30/2023    9:25 AM 01/20/2022    3:36 PM 08/03/2021   10:06 AM  Depression screen PHQ 2/9  Decreased Interest 0 0 0 0 0  Down, Depressed, Hopeless 0 0 0 0 0  PHQ - 2 Score 0 0 0 0 0  Altered sleeping 0 0     Tired, decreased energy 0 0     Change in appetite 0 0     Feeling bad or failure about yourself  0 0     Trouble concentrating 0 0     Moving slowly or fidgety/restless 0 0     Suicidal thoughts 0 0     PHQ-9 Score 0 0     Difficult doing work/chores Not difficult at all Not difficult at all          Cognitive assessment: the patient manages all their financial and personal affairs and is actively engaged. They could relate day,date,year and events; recalled 3/3 objects at 3 minutes; performed clock-face test normally.  The following portions of the patient's history were reviewed and updated as appropriate: allergies, current medications, past family history, past medical history,  past surgical  history, past social history  and problem list.  During the course of the visit the patient was educated and counseled about appropriate screening and preventive services including : fall prevention , diabetes screening, nutrition counseling, colorectal cancer screening, and recommended immunizations   Immunization History  Administered Date(s) Administered   Td 10/13/2004, 05/13/2011  HMLISTPATIENTFRIENDLY@ Health Maintenance Due  Topic Date Due   Medicare Annual Wellness (AWV)  Never done   DEXA SCAN  09/23/2022    Last skin cancer screening : annually every February.  No history of CA      Vital Signs: BP 130/74   Pulse 99   Ht 5\' 3"  (1.6 m)   Wt 161 lb (73 kg)   SpO2 96%   BMI 28.52 kg/m    Exam: General appearance:  alert, cooperative and appears stated age Head: Normocephalic, without obvious abnormality, atraumatic Eyes: conjunctivae/corneas clear. PERRL, EOM's intact. Fundi benign. Ears: normal TM's and external ear canals both ears Nose: Nares normal. Septum midline. Mucosa normal. No drainage or sinus tenderness. Throat: lips, mucosa, and tongue normal; teeth and gums normal Neck: no adenopathy, no carotid bruit, no JVD, supple, symmetrical, trachea midline and thyroid not enlarged, symmetric, no tenderness/mass/nodules Lungs: clear to auscultation bilaterally Breasts: normal appearance, no masses or tenderness Heart: regular rate and rhythm, S1, S2 normal, no murmur, click, rub or gallop Abdomen: soft, non-tender; bowel sounds normal; no masses,  no organomegaly Extremities: extremities normal, atraumatic, no cyanosis or edema Pulses: 2+ and symmetric Skin: Skin color, texture, turgor normal. No rashes or lesions Neurologic: Alert and oriented X 3, normal strength and tone. Normal symmetric reflexes. Normal coordination and gait.     End of Life Discussion and Planning   During the course of the visit , End of Life objectives were discussed at length.  Patient does not have a living will in place or a healthcare power of attorney.  Patient  was given printed information about advance directives and encouraged to return after discussing with their family.  Review of Opioid Prescriptions    Patient does not have a current opioid prescription: yes  Patient's risk factors for opioid use disorder was reviewed and includes nothing Treatment of pain using non-opioid alternatives was reviewed  and encouraged   Patient risk for potential substance abuse was assessed and addressed with counselling.    HPI:  Was referred to [facility] for cardiac risk assessment .  CAC scroe was 600  , ECHO and Myoview done,   advised to take a statin . She was not satisfied because she requested a repeat [facility] Heart  Diagnostic study  and NMR lipoprotein profile but was denied these studies and advised to start a statin.  She is requesting a second opinion form Dr. Kirke Corin for a second opinion.  FAther had an AMI at age 16. .  She is a lifelong nonsmoker.      Assessment and Plan:

## 2023-07-19 DIAGNOSIS — H833X9 Noise effects on inner ear, unspecified ear: Secondary | ICD-10-CM | POA: Insufficient documentation

## 2023-07-19 NOTE — Assessment & Plan Note (Signed)

## 2023-07-19 NOTE — Assessment & Plan Note (Signed)
She was diagnosed with mild sensorineural hearing lss decades ago, attributed to concert attendance.  She cannot understand a whispered voice at 5 feet  ;referral to audiology made.

## 2023-07-19 NOTE — Assessment & Plan Note (Signed)
Recurrent , now managed by podiatry in [place].

## 2023-07-19 NOTE — Assessment & Plan Note (Signed)
After a noninvasive cardiac workup by Dr Jacinto Halim, for CAC score of 600, she has requested a second opinion from Dr Kirke Corin before starting statin therapy .  She s requesting a repeat [place] heart study   Lab Results  Component Value Date   CHOL 198 01/30/2023   HDL 59.70 01/30/2023   LDLCALC 112 (H) 01/30/2023   LDLDIRECT 115.0 01/30/2023   TRIG 135.0 01/30/2023   CHOLHDL 3 01/30/2023

## 2023-07-25 ENCOUNTER — Ambulatory Visit: Payer: PPO | Admitting: Podiatry

## 2023-09-17 IMAGING — CT CT CARDIAC CORONARY ARTERY CALCIUM SCORE
3 series · 14 of 20 positions shown, 16 images · non-contrast
Comparison: None.

CLINICAL DATA: 65-year-old Caucasian female with family history of
heart disease.

EXAM:
CT CARDIAC CORONARY ARTERY CALCIUM SCORE
TECHNIQUE: Non-contrast imaging through the heart was performed using
prospective ECG gating. Image post processing was performed on an
independent workstation, allowing for quantitative analysis of the
heart and coronary arteries. Note that this exam targets the heart
and the chest was not imaged in its entirety.

[Series 2: calcium scoring 2.00 qr36 bestdiast 71% hrt calciu · axial · 0.40mm/px · z∈[+1591,+1671]mm · 4 of 68 slices shown]
[im 14/68  vessel]
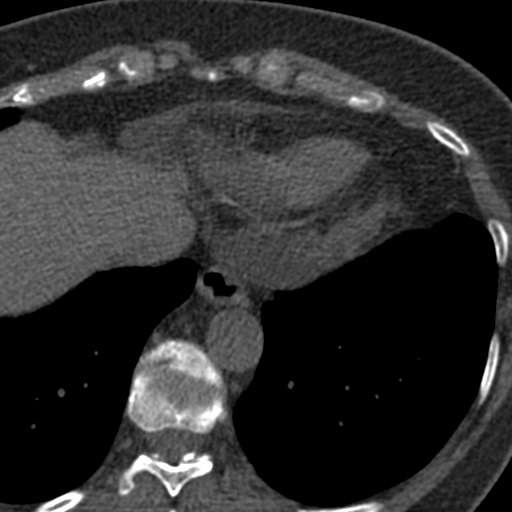
[im 27/68  vessel]
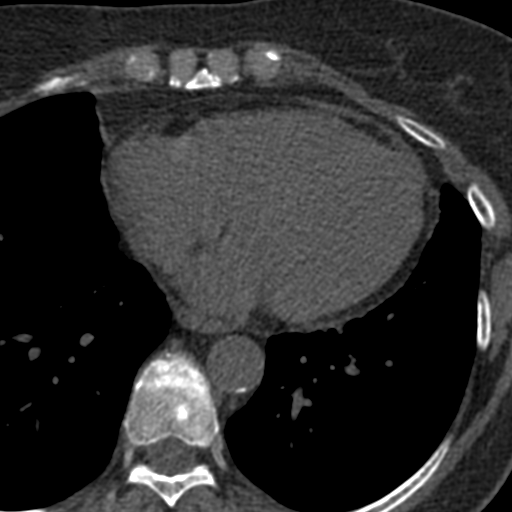
[im 41/68  vessel]
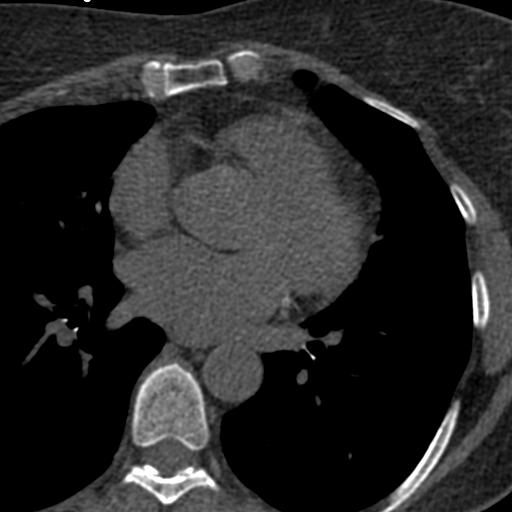
[im 54/68  vessel]
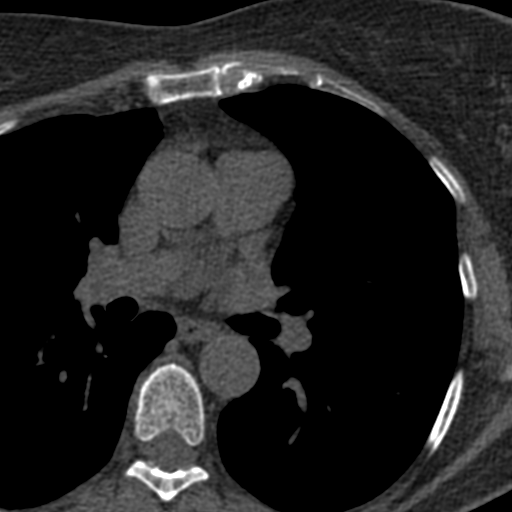

[Series 3: calcium scoring 2.00 br40 bestdiast 71% axial · axial · 0.50mm/px · z∈[+1587,+1675]mm · 5 of 68 slices shown, 7 images]
[im 12/68  vessel]
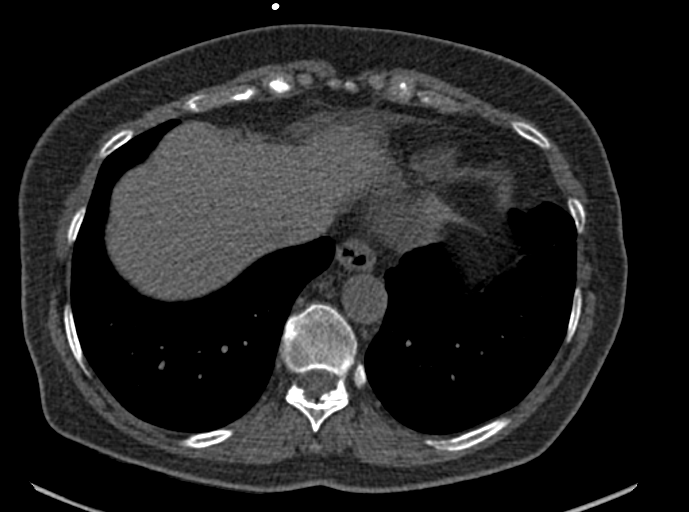
[im 12/68  lung]
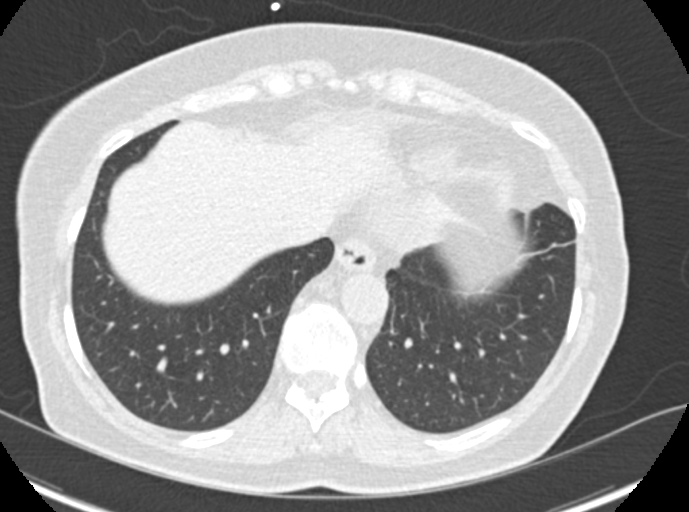
[im 23/68  vessel]
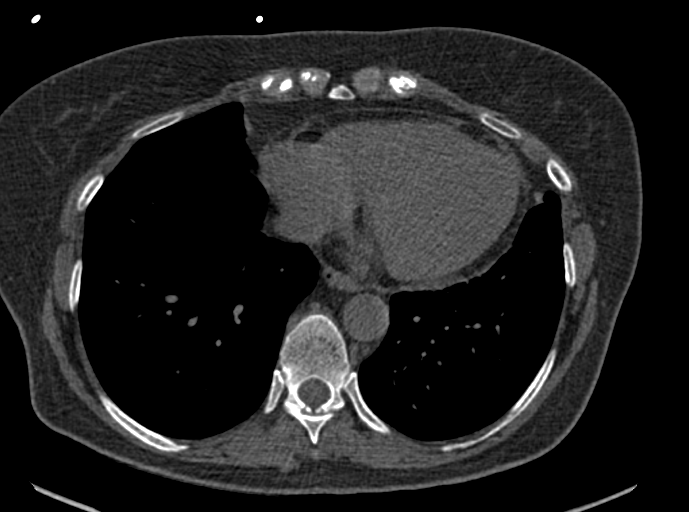
[im 34/68  vessel]
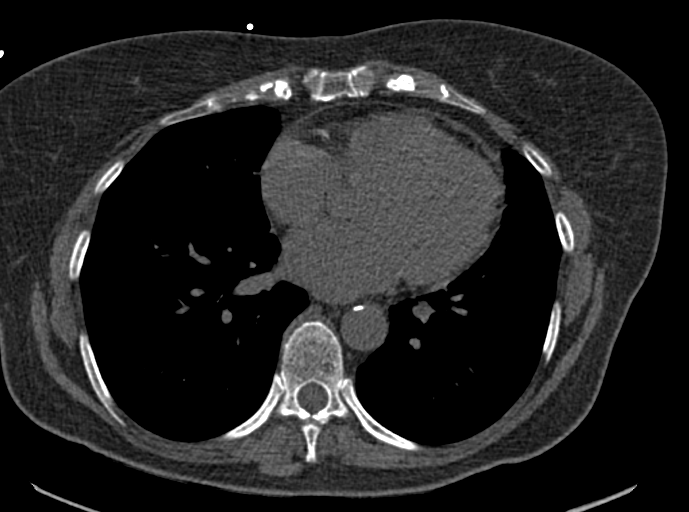
[im 45/68  vessel]
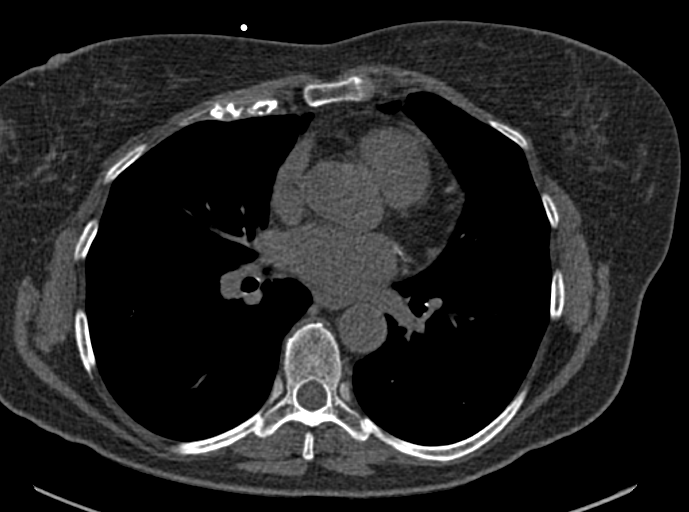
[im 56/68  vessel]
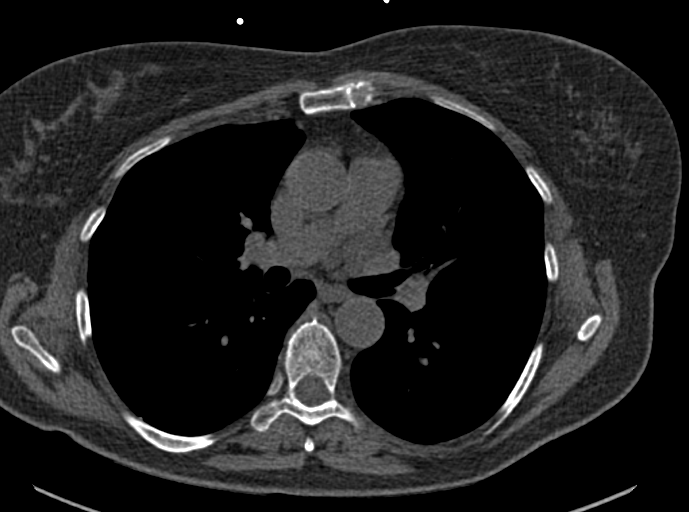
[im 56/68  lung]
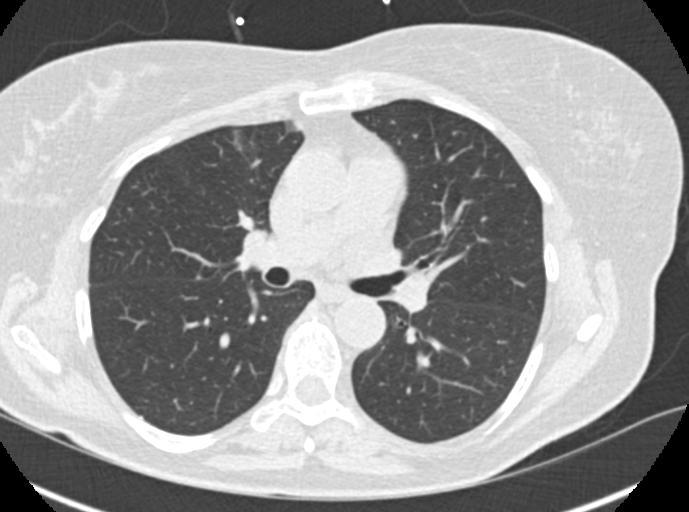

[Series 9: calcium scoring 2.00 br60 bestdiast 71% lungs · axial · 0.50mm/px · z∈[+1587,+1675]mm · 5 of 68 slices shown]
[im 12/68  vessel]
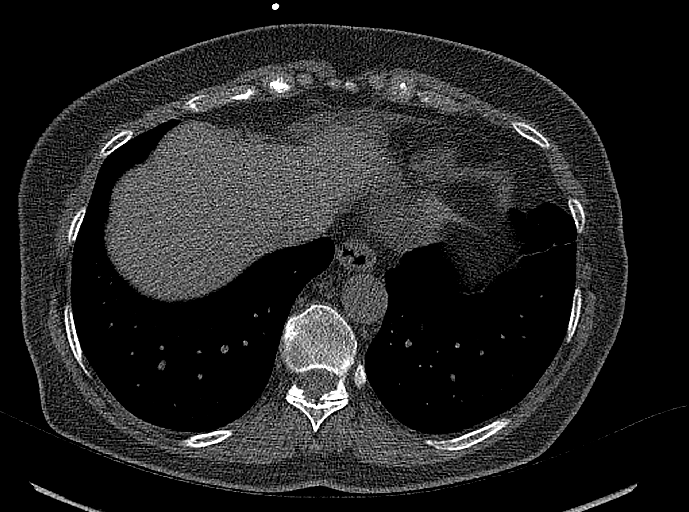
[im 23/68  vessel]
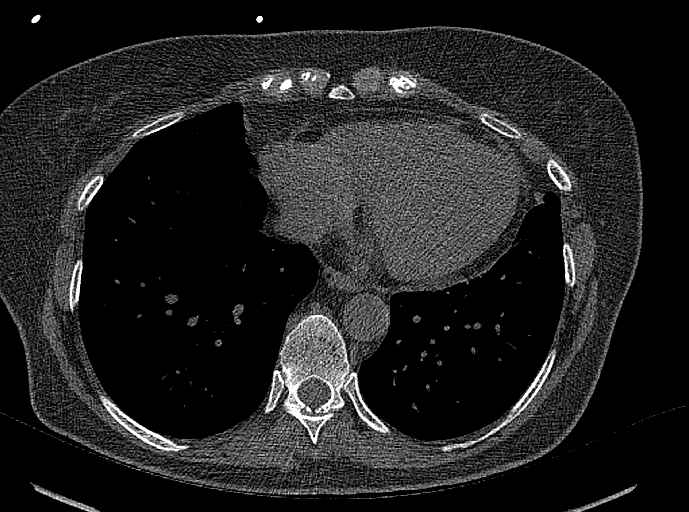
[im 34/68  vessel]
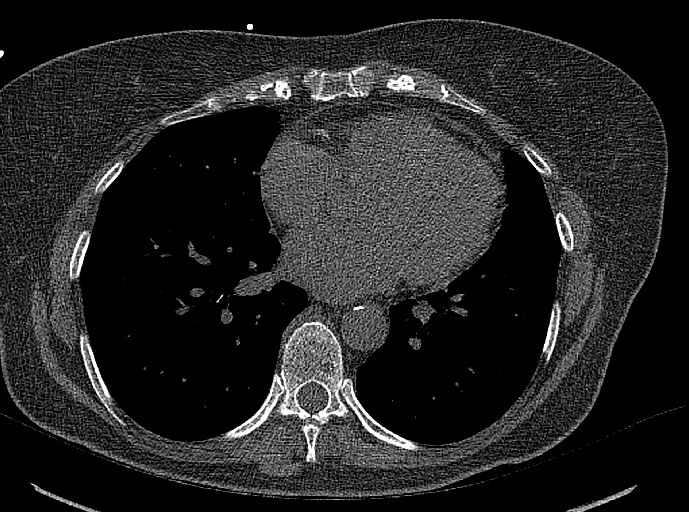
[im 45/68  vessel]
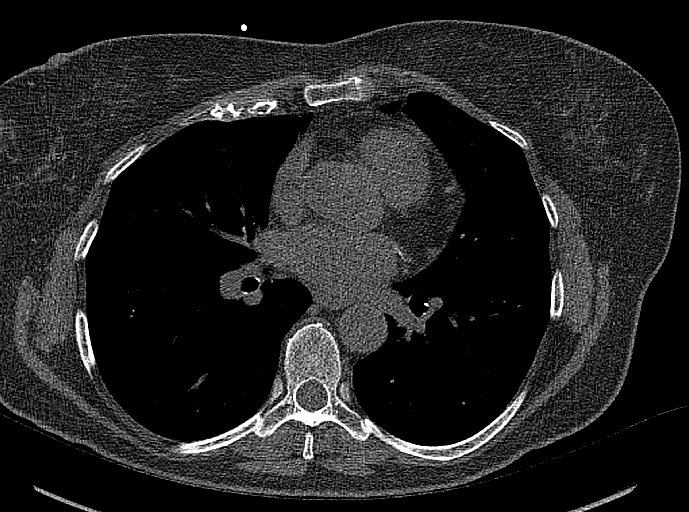
[im 56/68  vessel]
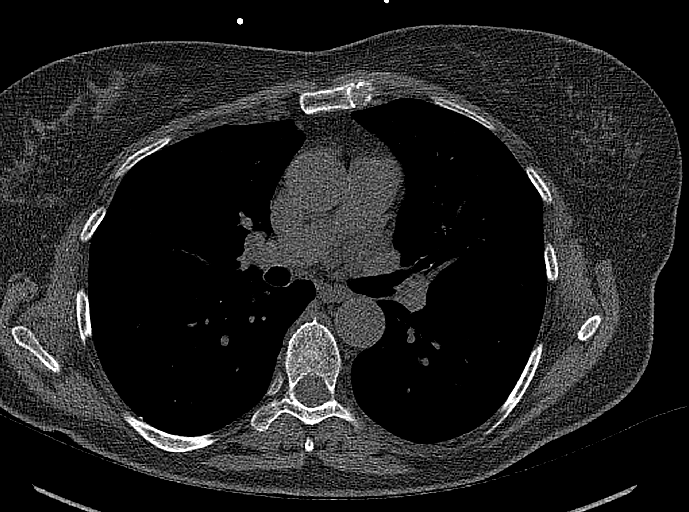

[14 of 20 positions shown; findings below may reference images not displayed]

FINDINGS: CORONARY CALCIUM SCORES:

Left Main: 0

LAD: 324

LCx: 205

RCA: 149

Total Agatston Score: 678

[HOSPITAL] percentile: 96

AORTA MEASUREMENTS:

Ascending Aorta: 30 mm

Descending Aorta: 24 mm

OTHER FINDINGS:

The heart size is within normal limits. No pericardial fluid is
identified. Visualized segments of the thoracic aorta and central
pulmonary arteries are normal in caliber. Visualized mediastinum and
hilar regions demonstrate no lymphadenopathy or masses. Visualized
lungs show no evidence of pulmonary edema, consolidation,
pneumothorax, nodule or pleural fluid. Visualized upper abdomen and
bony structures are unremarkable.
IMPRESSION: Coronary calcium score 678 is at the 96th percentile for the
patient's age, sex and race.

## 2023-09-21 ENCOUNTER — Encounter: Payer: Self-pay | Admitting: Cardiology

## 2023-10-09 ENCOUNTER — Telehealth: Payer: Self-pay | Admitting: Cardiology

## 2023-10-09 DIAGNOSIS — L578 Other skin changes due to chronic exposure to nonionizing radiation: Secondary | ICD-10-CM | POA: Diagnosis not present

## 2023-10-09 DIAGNOSIS — L218 Other seborrheic dermatitis: Secondary | ICD-10-CM | POA: Diagnosis not present

## 2023-10-09 DIAGNOSIS — D485 Neoplasm of uncertain behavior of skin: Secondary | ICD-10-CM | POA: Diagnosis not present

## 2023-10-09 DIAGNOSIS — D225 Melanocytic nevi of trunk: Secondary | ICD-10-CM | POA: Diagnosis not present

## 2023-10-09 DIAGNOSIS — Z872 Personal history of diseases of the skin and subcutaneous tissue: Secondary | ICD-10-CM | POA: Diagnosis not present

## 2023-10-09 DIAGNOSIS — G548 Other nerve root and plexus disorders: Secondary | ICD-10-CM | POA: Diagnosis not present

## 2023-10-09 DIAGNOSIS — L853 Xerosis cutis: Secondary | ICD-10-CM | POA: Diagnosis not present

## 2023-10-09 NOTE — Telephone Encounter (Signed)
 Good with me.  Thank you!

## 2023-10-09 NOTE — Telephone Encounter (Signed)
 Patient is requesting to do a provider switch from Dr. Jacinto Halim to Dr. Kirke Corin. Would both of you approve the provider switch?

## 2023-10-11 NOTE — Telephone Encounter (Signed)
 That is fine with me but I can only see her in the [place] office .

## 2023-10-24 DIAGNOSIS — E119 Type 2 diabetes mellitus without complications: Secondary | ICD-10-CM | POA: Diagnosis not present

## 2023-10-24 DIAGNOSIS — H40003 Preglaucoma, unspecified, bilateral: Secondary | ICD-10-CM | POA: Diagnosis not present

## 2023-11-02 DIAGNOSIS — D225 Melanocytic nevi of trunk: Secondary | ICD-10-CM | POA: Diagnosis not present

## 2023-11-02 DIAGNOSIS — D235 Other benign neoplasm of skin of trunk: Secondary | ICD-10-CM | POA: Diagnosis not present

## 2023-12-11 ENCOUNTER — Ambulatory Visit (INDEPENDENT_AMBULATORY_CARE_PROVIDER_SITE_OTHER): Admitting: Internal Medicine

## 2023-12-11 ENCOUNTER — Encounter: Payer: Self-pay | Admitting: Internal Medicine

## 2023-12-11 ENCOUNTER — Ambulatory Visit: Payer: Self-pay

## 2023-12-11 VITALS — BP 131/78 | HR 87 | Temp 98.9°F | Ht 63.0 in | Wt 158.4 lb

## 2023-12-11 DIAGNOSIS — I959 Hypotension, unspecified: Secondary | ICD-10-CM | POA: Insufficient documentation

## 2023-12-11 DIAGNOSIS — R5383 Other fatigue: Secondary | ICD-10-CM | POA: Diagnosis not present

## 2023-12-11 DIAGNOSIS — K219 Gastro-esophageal reflux disease without esophagitis: Secondary | ICD-10-CM

## 2023-12-11 NOTE — Assessment & Plan Note (Signed)
-   Patient complained of intermittent epigastric discomfort worse on lying down for the last 3 to 4 days. -No diaphoresis or chest pain or shortness of breath.  No palpitations or lightheadedness or syncope -EKG done today showed normal sinus rhythm with no acute ST/T wave changes -She does state the symptoms started after she ate [place] food outside -I suspect her symptoms likely secondary to recurrent reflux -Advised patient to take over-the-counter Pepcid  to see if this will help with her symptoms -Return precautions given to the patient

## 2023-12-11 NOTE — Progress Notes (Signed)
 Acute Office Visit  Subjective:     Patient ID: Melanie Ward, female    DOB: 01/09/1956, 68 y.o.   MRN: [id number]  Chief Complaint  Patient presents with   Fatigue    HPI Patient is in today for fatigue and borderline hypotension.  Patient states that for the last 3 to 4 days she has felt fatigued.  She is uncertain as to the reason behind this.  She denies any fevers or chills.  No URI symptoms.  No dysuria or increased urinary frequency.  No diarrhea.  No nausea or vomiting.  She denies any hematuria or hematochezia.  She does state that she has been gardening over the last week and has been drinking the same amount of water that she normally does.  She did complain of some intermittent epigastric discomfort worse on laying flat similar to her previous GERD symptoms.  No chest pain, no diaphoresis, no palpitations, no lightheadedness, no syncope.  Review of Systems  Constitutional:  Positive for malaise/fatigue. Negative for chills, diaphoresis and fever.  Respiratory: Negative.    Cardiovascular: Negative.   Gastrointestinal:  Positive for heartburn. Negative for abdominal pain, nausea and vomiting.  Musculoskeletal: Negative.   Neurological: Negative.  Negative for dizziness, focal weakness, loss of consciousness and weakness.  Psychiatric/Behavioral: Negative.          Objective:    BP 131/78   Pulse 87   Temp 98.9 F (37.2 C) (Oral)   Ht 5\' 3"  (1.6 m)   Wt 158 lb 6.4 oz (71.8 kg)   SpO2 97%   BMI 28.06 kg/m    Physical Exam Constitutional:      Appearance: Normal appearance.  HENT:     Head: Normocephalic and atraumatic.  Cardiovascular:     Rate and Rhythm: Normal rate and regular rhythm.     Heart sounds: Normal heart sounds.  Pulmonary:     Effort: Pulmonary effort is normal. No respiratory distress.     Breath sounds: No wheezing or rales.  Abdominal:     General: Bowel sounds are normal. There is no distension.     Palpations: Abdomen is soft.      Tenderness: There is no abdominal tenderness. There is no guarding or rebound.  Neurological:     Mental Status: She is alert and oriented to person, place, and time.  Psychiatric:        Mood and Affect: Mood normal.        Behavior: Behavior normal.     No results found for any visits on 12/11/23.      Assessment & Plan:   Problem List Items Addressed This Visit       Cardiovascular and Mediastinum   Hypotension - Primary   - Patient states she been fatigued over the last 3 to 4 days and over the last couple days with monitor her blood pressure has been borderline hypotensive -She states that her systolic blood pressures range from the 90s to the low 100s -Blood pressure today in the office was 122/74 -Orthostatics were negative -Patient denies any bleeding (no hematuria or hematochezia) or trauma -I suspect this likely secondary to mild volume depletion secondary to dehydration -Blood pressure today appears to be at baseline. -Will follow-up CBC (to see if she has anemia contributing to this) and BMP (to see if she has an AKI secondary to dehydration) -Patient encouraged to hydrate more on days that she is working on the garden -No further workup at this  time      Relevant Orders   EKG 12-Lead (Completed)   CBC with Differential/Platelet   Comprehensive metabolic panel with GFR     Digestive   GERD (gastroesophageal reflux disease)   - Patient complained of intermittent epigastric discomfort worse on lying down for the last 3 to 4 days. -No diaphoresis or chest pain or shortness of breath.  No palpitations or lightheadedness or syncope -EKG done today showed normal sinus rhythm with no acute ST/T wave changes -She does state the symptoms started after she ate [place] food outside -I suspect her symptoms likely secondary to recurrent reflux -Advised patient to take over-the-counter Pepcid  to see if this will help with her symptoms -Return precautions given to the  patient        Other   Fatigue   - Patient complains of increased fatigue over the last 3 to 4 days -Etiology behind the fatigue remains uncertain at this time -However, patient does state that she has been gardening more outside and drinking the same amount of water -I suspect that her symptoms are likely secondary to mild dehydration -However, we will check a CBC, CMP and TSH to rule out other etiologies for her fatigue -Encouraged increased hydration on days that she is out gardening -No further workup at this time      Relevant Orders   CBC with Differential/Platelet   Comprehensive metabolic panel with GFR   TSH    No orders of the defined types were placed in this encounter.   No follow-ups on file.  Ahonesty Woodfin, MD

## 2023-12-11 NOTE — Assessment & Plan Note (Signed)
-   Patient complains of increased fatigue over the last 3 to 4 days -Etiology behind the fatigue remains uncertain at this time -However, patient does state that she has been gardening more outside and drinking the same amount of water -I suspect that her symptoms are likely secondary to mild dehydration -However, we will check a CBC, CMP and TSH to rule out other etiologies for her fatigue -Encouraged increased hydration on days that she is out gardening -No further workup at this time

## 2023-12-11 NOTE — Telephone Encounter (Signed)
  Chief Complaint: Fatigue, low blood pressure Symptoms: fatigue, low blood pressure Frequency: constant x 3 days Pertinent Negatives: Patient denies shortness of breath, dizziness, fever, pain.  Disposition: [] ED /[] Urgent Care (no appt availability in office) / [x] Appointment(In office/virtual)/ []  [facility]/ [] Home Care/ [] Refused Recommended Disposition /[]  Mobile Bus/ []  Follow-up with PCP Additional Notes:  Requesting appointment for fatigue and low blood pressure. Fatigue for 3 days, intensity varies.  Yesterday started checking bp and consistently low. Sunday evening 90/60, Sunday night 95/65, this morning 101/55. Denies all other symptoms at this time other than fatigue. She feels well hydrated. Nothing makes her symptoms worse or better. Saturday and Sunday night she woke in the middle of the night with mild heaviness in her chest, states not painful just aware, she believes it was indigestion as she ate [place] food and has issues with reflux in the past. Acute visit scheduled with alternate provider today. Educated on care advice as documented in protocol, patient verbalized understanding. Discussed reasons to call back or call for EMS.     Copied from CRM [id number]. Topic: Clinical - Red Word Triage >> Dec 11, 2023 10:45 AM Freya Jesus wrote: Red Word that prompted transfer to Nurse Triage: Patient said she's fatigued and blood pressure has been low. Reason for Disposition  [1] Fall in systolic BP > 20 mm Hg from normal AND [2] NOT dizzy, lightheaded, or weak  Protocols used: Blood Pressure - Low-A-AH

## 2023-12-11 NOTE — Assessment & Plan Note (Signed)
-   Patient states she been fatigued over the last 3 to 4 days and over the last couple days with monitor her blood pressure has been borderline hypotensive -She states that her systolic blood pressures range from the 90s to the low 100s -Blood pressure today in the office was 122/74 -Orthostatics were negative -Patient denies any bleeding (no hematuria or hematochezia) or trauma -I suspect this likely secondary to mild volume depletion secondary to dehydration -Blood pressure today appears to be at baseline. -Will follow-up CBC (to see if she has anemia contributing to this) and BMP (to see if she has an AKI secondary to dehydration) -Patient encouraged to hydrate more on days that she is working on the garden -No further workup at this time

## 2023-12-11 NOTE — Patient Instructions (Addendum)
-   It was a pleasure meeting you today -I suspect that the reason that your blood pressure has been borderline and that you have been feeling fatigued is that you are mildly dehydrated given that she has been working in the garden and drinking the same amount of water -Would encourage increased water intake on days that you are out in the garden -I suspect that your abdominal discomfort is secondary to reflux.  Your EKG showed no evidence of a heart issue at this time -We will check some blood work on you today including your blood counts, kidney function and liver function and thyroid  function to make sure that there are no other causes for your fatigue -Please contact us  with any questions or concerns or if your symptoms worsen

## 2023-12-12 LAB — COMPREHENSIVE METABOLIC PANEL WITH GFR
ALT: 17 U/L (ref 0–35)
AST: 24 U/L (ref 0–37)
Albumin: 4.4 g/dL (ref 3.5–5.2)
Alkaline Phosphatase: 85 U/L (ref 39–117)
BUN: 14 mg/dL (ref 6–23)
CO2: 28 meq/L (ref 19–32)
Calcium: 9.7 mg/dL (ref 8.4–10.5)
Chloride: 105 meq/L (ref 96–112)
Creatinine, Ser: 0.72 mg/dL (ref 0.40–1.20)
GFR: 85.3 mL/min (ref >60.00)
Glucose, Bld: 96 mg/dL (ref 70–99)
Potassium: 4.9 meq/L (ref 3.5–5.1)
Sodium: 139 meq/L (ref 135–145)
Total Bilirubin: 0.5 mg/dL (ref 0.2–1.2)
Total Protein: 6.8 g/dL (ref 6.0–8.3)

## 2023-12-12 LAB — CBC WITH DIFFERENTIAL/PLATELET
Basophils Absolute: 0.1 10*3/uL (ref 0.0–0.1)
Basophils Relative: 1.4 % (ref 0.0–3.0)
Eosinophils Absolute: 0 10*3/uL (ref 0.0–0.7)
Eosinophils Relative: 0.6 % (ref 0.0–5.0)
HCT: 38.6 % (ref 36.0–46.0)
Hemoglobin: 12.9 g/dL (ref 12.0–15.0)
Lymphocytes Relative: 34.9 % (ref 12.0–46.0)
Lymphs Abs: 1.8 10*3/uL (ref 0.7–4.0)
MCHC: 33.4 g/dL (ref 30.0–36.0)
MCV: 95.8 fl (ref 78.0–100.0)
Monocytes Absolute: 0.4 10*3/uL (ref 0.1–1.0)
Monocytes Relative: 8.1 % (ref 3.0–12.0)
Neutro Abs: 2.9 10*3/uL (ref 1.4–7.7)
Neutrophils Relative %: 55 % (ref 43.0–77.0)
Platelets: 213 10*3/uL (ref 150.0–400.0)
RBC: 4.03 Mil/uL (ref 3.87–5.11)
RDW: 13.4 % (ref 11.5–15.5)
WBC: 5.3 10*3/uL (ref 4.0–10.5)

## 2023-12-12 LAB — TSH: TSH: 1.07 u[IU]/mL (ref 0.35–5.50)

## 2024-01-01 ENCOUNTER — Encounter: Payer: Self-pay | Admitting: Internal Medicine

## 2024-01-03 ENCOUNTER — Ambulatory Visit: Payer: PPO | Admitting: Cardiology

## 2024-01-23 ENCOUNTER — Ambulatory Visit: Attending: Cardiovascular Disease | Admitting: Cardiovascular Disease

## 2024-01-23 ENCOUNTER — Encounter: Payer: Self-pay | Admitting: Cardiovascular Disease

## 2024-01-23 VITALS — BP 128/70 | HR 91 | Ht 63.0 in | Wt 160.1 lb

## 2024-01-23 DIAGNOSIS — E785 Hyperlipidemia, unspecified: Secondary | ICD-10-CM

## 2024-01-23 DIAGNOSIS — R931 Abnormal findings on diagnostic imaging of heart and coronary circulation: Secondary | ICD-10-CM | POA: Diagnosis not present

## 2024-01-23 MED ORDER — ASPIRIN 81 MG PO TBEC: 81.0000 mg | DELAYED_RELEASE_TABLET | Freq: Every day | ORAL | Status: AC

## 2024-01-23 NOTE — Patient Instructions (Signed)
 Medication Instructions:  START Aspirin 81 mg once daily  *If you need a refill on your cardiac medications before your next appointment, please call your pharmacy*  Lab Work: None ordered If you have labs (blood work) drawn today and your tests are completely normal, you will receive your results only by: MyChart Message (if you have MyChart) OR A paper copy in the mail If you have any lab test that is abnormal or we need to change your treatment, we will call you to review the results.  Testing/Procedures: None ordered  Follow-Up: At [facility], you and your health needs are our priority.  As part of our continuing mission to provide you with exceptional heart care, our providers are all part of one team.  This team includes your primary Cardiologist (physician) and Advanced Practice Providers or APPs (Physician Assistants and Nurse Practitioners) who all work together to provide you with the care you need, when you need it.  Your next appointment:   12 month(s)  Provider:   You may see Dr. Alvenia Aus or one of the following Advanced Practice Providers on your designated Care Team:   Laneta Pintos, NP Gildardo Labrador, PA-C Varney Gentleman, PA-C Cadence [place], PA-C Ronald Cockayne, NP Morey Ar, NP    We recommend signing up for the patient portal called "MyChart".  Sign up information is provided on this After Visit Summary.  MyChart is used to connect with patients for Virtual Visits (Telemedicine).  Patients are able to view lab/test results, encounter notes, upcoming appointments, etc.  Non-urgent messages can be sent to your provider as well.   To learn more about what you can do with MyChart, go to [email or web address].

## 2024-01-23 NOTE — Progress Notes (Signed)
 Cardiology Office Note   Date:  01/23/2024   ID:  Terree, Gaultney Oct 22, 1955, MRN [id number]  PCP:  Thersia Flax, MD  Cardiologist:   Antionette Kirks, MD   Chief Complaint  Patient presents with   Follow-up    Former [facility] PT no complaints today. Meds reviewed verbally with pt.      History of Present Illness: Melanie Ward is a 68 y.o. female who presents to establish cardiovascular care. She has history of remote paroxysmal supraventricular tachycardia, hyperlipidemia, GERD, family history of coronary artery disease and elevated coronary calcium  score.  Her father had myocardial infarction at the age of 34 and died after revascularization.  CT calcium  score was performed in December 2022 which was elevated at 678.  Echocardiogram in November 2022 showed normal LV systolic function with mild mitral regurgitation.  Treadmill nuclear stress test in January 2023 showed no evidence of ischemia.  She has been doing well with no chest pain or shortness of breath.  No palpitations. She follows plant-based diet and stays active overall.  Past Medical History:  Diagnosis Date   Cardiac arrhythmia    pt reports under a lot of stress at the time of diagnosis, unsure of type of dysrhythmia, has not experienced since   Endometriosis 1992   GERD (gastroesophageal reflux disease)    Hearing loss due to cerumen impaction, right 05/30/2017   irrigatio nneeded    History of chicken pox    PONV (postoperative nausea and vomiting)    after one of her endemotriosis surgeries    Past Surgical History:  Procedure Laterality Date   APPENDECTOMY  1973   BROW LIFT Bilateral 10/30/2020   Procedure: BLEPHAROPLASTY UPPER EYELID; W/EXCESS SKIN BILATERAL DIABETIC;  Surgeon: Zacarias Hermann, MD;  Location: [facility];  Service: Ophthalmology;  Laterality: Bilateral;   LAPAROSCOPIC ENDOMETRIOSIS FULGURATION  1992   endometriosis surgery x 2 in 1992   TONSILLECTOMY AND ADENOIDECTOMY  1964      Current Outpatient Medications  Medication Sig Dispense Refill   Cholecalciferol (VITAMIN D ) 2000 UNITS CAPS Take by mouth.     Magnesium 400 MG CAPS Take 1 capsule by mouth daily.     Multiple Vitamin (MULTIVITAMIN) tablet Take 1 tablet by mouth daily.     VITAMIN K PO Take by mouth.     No current facility-administered medications for this visit.    Allergies:   Zithromax [azithromycin]    Social History:  The patient  reports that she has never smoked. She has never used smokeless tobacco. She reports that she does not drink alcohol and does not use drugs.   Family History:  The patient's family history includes Cancer in her mother; Heart attack (age of onset: 13) in her maternal grandmother; Heart disease (age of onset: 80) in her father; Hypertension in her mother; Stroke in her maternal grandfather.    ROS:  Please see the history of present illness.   Otherwise, review of systems are positive for none.   All other systems are reviewed and negative.    PHYSICAL EXAM: VS:  BP 128/70 (BP Location: Left Arm, Patient Position: Sitting, Cuff Size: Normal)   Pulse 91   Ht 5\' 3"  (1.6 m)   Wt 160 lb 2 oz (72.6 kg)   SpO2 98%   BMI 28.36 kg/m  , BMI Body mass index is 28.36 kg/m. GEN: Well nourished, well developed, in no acute distress  HEENT: normal  Neck: no JVD,  carotid bruits, or masses Cardiac: RRR; no murmurs, rubs, or gallops,no edema  Respiratory:  clear to auscultation bilaterally, normal work of breathing GI: soft, nontender, nondistended, + BS MS: no deformity or atrophy  Skin: warm and dry, no rash Neuro:  Strength and sensation are intact Psych: euthymic mood, full affect   EKG:  EKG is ordered today. The ekg ordered today demonstrates ; Normal sinus rhythm Possible Left atrial enlargement Low voltage QRS Nonspecific ST abnormality    Recent Labs: 12/11/2023: ALT 17; BUN 14; Creatinine, Ser 0.72; Hemoglobin 12.9; Platelets 213.0; Potassium 4.9;  Sodium 139; TSH 1.07    Lipid Panel    Component Value Date/Time   CHOL 198 01/30/2023 1002   TRIG 135.0 01/30/2023 1002   HDL 59.70 01/30/2023 1002   CHOLHDL 3 01/30/2023 1002   VLDL 27.0 01/30/2023 1002   LDLCALC 112 (H) 01/30/2023 1002   LDLDIRECT 115.0 01/30/2023 1002      Wt Readings from Last 3 Encounters:  01/23/24 160 lb 2 oz (72.6 kg)  12/11/23 158 lb 6.4 oz (71.8 kg)  07/18/23 161 lb (73 kg)          No data to display            ASSESSMENT AND PLAN:  1.  Elevated coronary calcium  score: Given elevated coronary calcium  score above 100, I do recommend aspirin 81 mg once daily.  She already follows a healthy lifestyle and I discussed with her the importance of controlling risk factors.  2.  Hyperlipidemia: Her LDL gradually improved over the last few years with healthier diet.  However, most recent LDL was 112.  Given her elevated coronary calcium  score, I recommend a target LDL of less than 70.  I discussed with her different medication options.  She is very hesitant about statins due to potential increased risk of diabetes.  I explained to her that benefits outweigh the risk in her situation. The other option is ezetimibe and she wants to think about this. She is going to see Dr. Madelon Scheuermann in the near future for a physical.  I recommend checking high-sensitivity C-reactive protein and LP(a) in addition to lipid profile.    Disposition:   FU with me in 1 year  Signed,  Antionette Kirks, MD  01/23/2024 1:45 PM    [facility]

## 2024-02-02 ENCOUNTER — Encounter: Payer: Self-pay | Admitting: Internal Medicine

## 2024-02-02 ENCOUNTER — Ambulatory Visit (INDEPENDENT_AMBULATORY_CARE_PROVIDER_SITE_OTHER): Payer: PPO | Admitting: Internal Medicine

## 2024-02-02 VITALS — BP 120/68 | HR 82 | Ht 63.0 in | Wt 160.2 lb

## 2024-02-02 DIAGNOSIS — N6324 Unspecified lump in the left breast, lower inner quadrant: Secondary | ICD-10-CM

## 2024-02-02 DIAGNOSIS — R7303 Prediabetes: Secondary | ICD-10-CM | POA: Diagnosis not present

## 2024-02-02 DIAGNOSIS — E785 Hyperlipidemia, unspecified: Secondary | ICD-10-CM | POA: Diagnosis not present

## 2024-02-02 DIAGNOSIS — R5383 Other fatigue: Secondary | ICD-10-CM

## 2024-02-02 DIAGNOSIS — Z780 Asymptomatic menopausal state: Secondary | ICD-10-CM

## 2024-02-02 DIAGNOSIS — Z0000 Encounter for general adult medical examination without abnormal findings: Secondary | ICD-10-CM | POA: Diagnosis not present

## 2024-02-02 DIAGNOSIS — M2550 Pain in unspecified joint: Secondary | ICD-10-CM | POA: Diagnosis not present

## 2024-02-02 DIAGNOSIS — Z0001 Encounter for general adult medical examination with abnormal findings: Secondary | ICD-10-CM

## 2024-02-02 DIAGNOSIS — Z1231 Encounter for screening mammogram for malignant neoplasm of breast: Secondary | ICD-10-CM | POA: Diagnosis not present

## 2024-02-02 DIAGNOSIS — R8290 Unspecified abnormal findings in urine: Secondary | ICD-10-CM

## 2024-02-02 LAB — LIPID PANEL
Cholesterol: 216 mg/dL — ABNORMAL HIGH (ref 0–200)
HDL: 65.3 mg/dL (ref >39.00)
LDL Cholesterol: 128 mg/dL — ABNORMAL HIGH (ref 0–99)
NonHDL: 150.51
Total CHOL/HDL Ratio: 3
Triglycerides: 115 mg/dL (ref 0.0–149.0)
VLDL: 23 mg/dL (ref 0.0–40.0)

## 2024-02-02 LAB — URINALYSIS, ROUTINE W REFLEX MICROSCOPIC
Bilirubin Urine: NEGATIVE
Hgb urine dipstick: NEGATIVE
Ketones, ur: NEGATIVE
Leukocytes,Ua: NEGATIVE
Nitrite: NEGATIVE
Specific Gravity, Urine: <=1.005 — AB (ref 1.000–1.030)
Total Protein, Urine: NEGATIVE
Urine Glucose: NEGATIVE
Urobilinogen, UA: 0.2 (ref 0.0–1.0)
pH: 6.5 (ref 5.0–8.0)

## 2024-02-02 LAB — COMPREHENSIVE METABOLIC PANEL WITH GFR
ALT: 19 U/L (ref 0–35)
AST: 24 U/L (ref 0–37)
Albumin: 4.5 g/dL (ref 3.5–5.2)
Alkaline Phosphatase: 87 U/L (ref 39–117)
BUN: 10 mg/dL (ref 6–23)
CO2: 30 meq/L (ref 19–32)
Calcium: 9.4 mg/dL (ref 8.4–10.5)
Chloride: 104 meq/L (ref 96–112)
Creatinine, Ser: 0.61 mg/dL (ref 0.40–1.20)
GFR: 91.12 mL/min (ref >60.00)
Glucose, Bld: 100 mg/dL — ABNORMAL HIGH (ref 70–99)
Potassium: 4.4 meq/L (ref 3.5–5.1)
Sodium: 139 meq/L (ref 135–145)
Total Bilirubin: 0.5 mg/dL (ref 0.2–1.2)
Total Protein: 6.6 g/dL (ref 6.0–8.3)

## 2024-02-02 LAB — HIGH SENSITIVITY CRP: CRP, High Sensitivity: 1.76 mg/L (ref 0.000–5.000)

## 2024-02-02 LAB — LDL CHOLESTEROL, DIRECT: Direct LDL: 126 mg/dL

## 2024-02-02 LAB — HEMOGLOBIN A1C: Hgb A1c MFr Bld: 6 % (ref 4.6–6.5)

## 2024-02-02 NOTE — Progress Notes (Unsigned)
 Patient ID: Melanie Ward, female    DOB: 06-18-1956  Age: 68 y.o. MRN: [id number]  The patient is here for annual preventive examination and management of other chronic and acute problems.   The risk factors are reflected in the social history.   The roster of all physicians providing medical care to patient - is listed in the Snapshot section of the chart.   Activities of daily living:  The patient is 100% independent in all ADLs: dressing, toileting, feeding as well as independent mobility   Home safety : The patient has smoke detectors in the home. They wear seatbelts.  There are no unsecured firearms at home. There is no violence in the home.    There is no risks for hepatitis, STDs or HIV. There is no   history of blood transfusion. They have no travel history to infectious disease endemic areas of the world.   The patient has seen their dentist in the last six month. They have seen their eye doctor in the last year. The patinet  denies slight hearing difficulty with regard to whispered voices and some television programs.  They have deferred audiologic testing in the last year.  They do not  have excessive sun exposure. Discussed the need for sun protection: hats, long sleeves and use of sunscreen if there is significant sun exposure.    Diet: the importance of a healthy diet is discussed. They do have a healthy diet.   The benefits of regular aerobic exercise were discussed. The patient  exercises  3 to 5 days per week  for  60 minutes.    Depression screen: there are no signs or vegative symptoms of depression- irritability, change in appetite, anhedonia, sadness/tearfullness.   The following portions of the patient's history were reviewed and updated as appropriate: allergies, current medications, past family history, past medical history,  past surgical history, past social history  and problem list.   Visual acuity was not assessed per patient preference since the patient has  regular follow up with an  ophthalmologist. Hearing and body mass index were assessed and reviewed.    During the course of the visit the patient was educated and counseled about appropriate screening and preventive services including : fall prevention , diabetes screening, nutrition counseling, colorectal cancer screening, and recommended immunizations.    Chief Complaint:  1) elevated CAC score:  has not started ASA because of fear of it causing gastritis.  I already have recurrent LUQ pain  that was evaluated by ED      Review of Symptoms  Patient denies headache, fevers, malaise, unintentional weight loss, skin rash, eye pain, sinus congestion and sinus pain, sore throat, dysphagia,  hemoptysis , cough, dyspnea, wheezing, chest pain, palpitations, orthopnea, edema, abdominal pain, nausea, melena, diarrhea, constipation, flank pain, dysuria, hematuria, urinary  Frequency, nocturia, numbness, tingling, seizures,  Focal weakness, Loss of consciousness,  Tremor, insomnia, depression, anxiety, and suicidal ideation.    Physical Exam:  BP 120/68   Pulse 82   Ht 5' 3 (1.6 m)   Wt 160 lb 3.2 oz (72.7 kg)   SpO2 99%   BMI 28.38 kg/m    Physical Exam  Assessment and Plan: Encounter for screening mammogram for malignant neoplasm of breast  Postmenopausal estrogen deficiency  Encounter for preventative adult health care examination  Hyperlipidemia LDL goal <100  Prediabetes    No follow-ups on file.  Melanie Flax, MD

## 2024-02-02 NOTE — Patient Instructions (Signed)
 Diagnostic mammogram and lfet breast ultrasound have been ordered   Future labs ordered for next occurrence of joint pain/fatigue  Consider taking a baby aspirin  twice weekly

## 2024-02-04 ENCOUNTER — Ambulatory Visit: Payer: Self-pay | Admitting: Internal Medicine

## 2024-02-04 DIAGNOSIS — Z0001 Encounter for general adult medical examination with abnormal findings: Secondary | ICD-10-CM | POA: Insufficient documentation

## 2024-02-04 DIAGNOSIS — N6324 Unspecified lump in the left breast, lower inner quadrant: Secondary | ICD-10-CM | POA: Insufficient documentation

## 2024-02-04 NOTE — Assessment & Plan Note (Signed)
 Diagnostic mammogram ordered.

## 2024-02-04 NOTE — Assessment & Plan Note (Signed)
 Statin and ASA therapy recommended given elevated CAC score   Lab Results  Component Value Date   CHOL 216 (H) 02/02/2024   HDL 65.30 02/02/2024   LDLCALC 128 (H) 02/02/2024   LDLDIRECT 126.0 02/02/2024   TRIG 115.0 02/02/2024   CHOLHDL 3 02/02/2024

## 2024-02-04 NOTE — Assessment & Plan Note (Signed)
 Episodic,  with polyarthralgias and histor of positive DS DNA<  labs ordered for next occurrence.

## 2024-02-04 NOTE — Assessment & Plan Note (Signed)

## 2024-02-07 LAB — LIPOPROTEIN A (LPA): Lipoprotein (a): 12 nmol/L (ref <75)

## 2024-02-08 ENCOUNTER — Other Ambulatory Visit: Payer: Self-pay | Admitting: *Deleted

## 2024-02-08 ENCOUNTER — Inpatient Hospital Stay
Admission: RE | Admit: 2024-02-08 | Discharge: 2024-02-08 | Disposition: A | Discharge status: Home / Self Care | Payer: Self-pay | Source: Ambulatory Visit | Attending: Internal Medicine | Admitting: Internal Medicine

## 2024-02-08 DIAGNOSIS — Z1231 Encounter for screening mammogram for malignant neoplasm of breast: Secondary | ICD-10-CM

## 2024-02-12 ENCOUNTER — Ambulatory Visit
Admission: RE | Admit: 2024-02-12 | Discharge: 2024-02-12 | Disposition: A | Discharge status: Home / Self Care | Source: Ambulatory Visit | Attending: Internal Medicine | Admitting: Internal Medicine

## 2024-02-12 DIAGNOSIS — N6324 Unspecified lump in the left breast, lower inner quadrant: Secondary | ICD-10-CM

## 2024-02-12 DIAGNOSIS — R92323 Mammographic fibroglandular density, bilateral breasts: Secondary | ICD-10-CM | POA: Diagnosis not present

## 2024-02-12 DIAGNOSIS — N6320 Unspecified lump in the left breast, unspecified quadrant: Secondary | ICD-10-CM | POA: Diagnosis not present

## 2024-04-04 ENCOUNTER — Ambulatory Visit
Admission: RE | Admit: 2024-04-04 | Discharge: 2024-04-04 | Disposition: A | Discharge status: Home / Self Care | Source: Ambulatory Visit | Attending: Internal Medicine | Admitting: Internal Medicine

## 2024-04-04 DIAGNOSIS — Z780 Asymptomatic menopausal state: Secondary | ICD-10-CM | POA: Diagnosis not present

## 2024-04-04 DIAGNOSIS — M8589 Other specified disorders of bone density and structure, multiple sites: Secondary | ICD-10-CM | POA: Diagnosis not present

## 2024-05-15 DIAGNOSIS — H40003 Preglaucoma, unspecified, bilateral: Secondary | ICD-10-CM | POA: Diagnosis not present

## 2024-05-22 DIAGNOSIS — H40003 Preglaucoma, unspecified, bilateral: Secondary | ICD-10-CM | POA: Diagnosis not present

## 2024-09-18 NOTE — Progress Notes (Signed)
 Melanie Ward                                          MRN: [id number]   09/18/2024   The [facility] Quality Team Specialist reviewed this patient medical record for the purposes of chart review for care gap closure. The following were reviewed: chart review for care gap closure-kidney health evaluation for diabetes:eGFR  and uACR.    [facility] Quality Team

## 2025-02-05 ENCOUNTER — Encounter: Admitting: Internal Medicine
